# Patient Record
Sex: Male | Born: 1955 | Race: White | Hispanic: No | Marital: Married | Smoking: Former smoker
Health system: Southern US, Community
[De-identification: ages and names within clinical notes are randomized; demographics above are authoritative.]

## PROBLEM LIST (undated history)

## (undated) DIAGNOSIS — G4730 Sleep apnea, unspecified: Secondary | ICD-10-CM

## (undated) DIAGNOSIS — I2510 Atherosclerotic heart disease of native coronary artery without angina pectoris: Secondary | ICD-10-CM

## (undated) DIAGNOSIS — F419 Anxiety disorder, unspecified: Secondary | ICD-10-CM

## (undated) DIAGNOSIS — F329 Major depressive disorder, single episode, unspecified: Secondary | ICD-10-CM

## (undated) DIAGNOSIS — F32A Depression, unspecified: Secondary | ICD-10-CM

---

## 1997-04-21 HISTORY — PX: BACK SURGERY: SHX140

## 1997-04-21 HISTORY — PX: CORONARY ARTERY BYPASS GRAFT: SHX141

## 1997-12-01 ENCOUNTER — Inpatient Hospital Stay (HOSPITAL_COMMUNITY): Admission: AD | Admit: 1997-12-01 | Discharge: 1997-12-09 | Payer: Self-pay | Admitting: Cardiology

## 1997-12-05 ENCOUNTER — Encounter: Payer: Self-pay | Admitting: Cardiothoracic Surgery

## 1997-12-06 ENCOUNTER — Encounter: Payer: Self-pay | Admitting: Cardiothoracic Surgery

## 1997-12-07 ENCOUNTER — Encounter: Payer: Self-pay | Admitting: Cardiothoracic Surgery

## 1997-12-08 ENCOUNTER — Encounter: Payer: Self-pay | Admitting: Cardiothoracic Surgery

## 2000-09-28 ENCOUNTER — Ambulatory Visit (HOSPITAL_COMMUNITY): Admission: RE | Admit: 2000-09-28 | Discharge: 2000-09-28 | Payer: Self-pay | Admitting: Family Medicine

## 2000-09-28 ENCOUNTER — Encounter: Payer: Self-pay | Admitting: Family Medicine

## 2000-10-05 ENCOUNTER — Ambulatory Visit (HOSPITAL_COMMUNITY): Admission: RE | Admit: 2000-10-05 | Discharge: 2000-10-05 | Payer: Self-pay | Admitting: Urology

## 2000-10-05 ENCOUNTER — Encounter: Payer: Self-pay | Admitting: Urology

## 2000-10-26 ENCOUNTER — Encounter: Payer: Self-pay | Admitting: Urology

## 2000-10-26 ENCOUNTER — Ambulatory Visit (HOSPITAL_COMMUNITY): Admission: RE | Admit: 2000-10-26 | Discharge: 2000-10-26 | Payer: Self-pay | Admitting: Urology

## 2002-09-05 ENCOUNTER — Ambulatory Visit (HOSPITAL_COMMUNITY): Admission: RE | Admit: 2002-09-05 | Discharge: 2002-09-05 | Payer: Self-pay | Admitting: Family Medicine

## 2002-09-05 ENCOUNTER — Encounter: Payer: Self-pay | Admitting: Family Medicine

## 2002-10-20 ENCOUNTER — Ambulatory Visit (HOSPITAL_COMMUNITY): Admission: RE | Admit: 2002-10-20 | Discharge: 2002-10-20 | Payer: Self-pay | Admitting: Urology

## 2002-10-20 ENCOUNTER — Encounter: Payer: Self-pay | Admitting: Urology

## 2002-11-01 ENCOUNTER — Ambulatory Visit (HOSPITAL_COMMUNITY): Admission: RE | Admit: 2002-11-01 | Discharge: 2002-11-01 | Payer: Self-pay | Admitting: Urology

## 2002-11-01 ENCOUNTER — Encounter: Payer: Self-pay | Admitting: Urology

## 2002-11-18 ENCOUNTER — Encounter: Payer: Self-pay | Admitting: Urology

## 2002-11-18 ENCOUNTER — Ambulatory Visit (HOSPITAL_COMMUNITY): Admission: RE | Admit: 2002-11-18 | Discharge: 2002-11-18 | Payer: Self-pay | Admitting: Urology

## 2004-10-18 ENCOUNTER — Emergency Department (HOSPITAL_COMMUNITY): Admission: EM | Admit: 2004-10-18 | Discharge: 2004-10-18 | Payer: Self-pay | Admitting: Emergency Medicine

## 2004-10-23 ENCOUNTER — Ambulatory Visit (HOSPITAL_COMMUNITY): Admission: RE | Admit: 2004-10-23 | Discharge: 2004-10-23 | Payer: Self-pay | Admitting: Urology

## 2004-12-05 ENCOUNTER — Ambulatory Visit (HOSPITAL_COMMUNITY): Admission: RE | Admit: 2004-12-05 | Discharge: 2004-12-05 | Payer: Self-pay | Admitting: Urology

## 2009-03-22 ENCOUNTER — Ambulatory Visit (HOSPITAL_COMMUNITY): Admission: RE | Admit: 2009-03-22 | Discharge: 2009-03-22 | Payer: Self-pay | Admitting: Family Medicine

## 2010-06-26 ENCOUNTER — Ambulatory Visit (HOSPITAL_COMMUNITY)
Admission: RE | Admit: 2010-06-26 | Discharge: 2010-06-26 | Disposition: A | Discharge status: Home / Self Care | Payer: PRIVATE HEALTH INSURANCE | Source: Ambulatory Visit | Attending: Family Medicine | Admitting: Family Medicine

## 2010-06-26 ENCOUNTER — Encounter (HOSPITAL_COMMUNITY): Payer: Self-pay

## 2010-06-26 ENCOUNTER — Other Ambulatory Visit (HOSPITAL_COMMUNITY): Payer: Self-pay | Admitting: Family Medicine

## 2010-06-26 DIAGNOSIS — R05 Cough: Secondary | ICD-10-CM | POA: Insufficient documentation

## 2010-06-26 DIAGNOSIS — R059 Cough, unspecified: Secondary | ICD-10-CM | POA: Insufficient documentation

## 2010-09-06 NOTE — H&P (Signed)
Scott Reeves, Scott Reeves                ACCOUNT NO.:  [id number]   MEDICAL RECORD NO.:  [id number]          PATIENT TYPE:  AMB   LOCATION:  DAY                           FACILITY:  [facility]   PHYSICIAN:  Ky Barban, M.D.DATE OF BIRTH:  1955/05/16   DATE OF ADMISSION:  10/21/2004  DATE OF DISCHARGE:  LH                                HISTORY & PHYSICAL   HISTORY OF PRESENT ILLNESS:  Scott Reeves is a 55 year old gentleman last seen  in July 2004.  At that time, he had a stone in the right ureter and we  attempted to do urethroscopic extraction.  At that time, I found his stone  is already gone.  I have not seen him since.  He did have a stone 5 mm in  the left kidney.  Last week he presented to the emergency room with right  ureteral colic.  He has another stone in the right kidney.  He was treated  symptomatically and he has no other medical problems.  He was advised to  undergo ESWL of the right renal calculus and I also told him once the right  side clears up, we need to go after the other stone in the left kidney  before it gets any bigger.  They understand and want me to go ahead and  schedule.   PAST MEDICAL HISTORY:  1.  ESWL left renal calculus.  2.  No history of diabetes or hypertension.   FAMILY HISTORY:  Unremarkable.   SOCIAL HISTORY:  He does not smoke or drink.   REVIEW OF SYSTEMS:  Unremarkable.   PHYSICAL EXAMINATION:  GENERAL:  Well-developed, well-nourished in no acute  distress.  VITAL SIGNS:  Blood pressure 130/80, temperature normal.  NEUROLOGIC:  Negative.  HEENT:  Neck is symmetrical.  CARDIAC:  Regular rate and rhythm.  ABDOMEN:  Soft.  Kidneys nonpalpable.  1+ right CVA tenderness.  GENITALIA:  Unremarkable.   IMPRESSION:  Right mild renal calculi.   PLAN:  ESWL on right.  Procedure with risks and complications and need for  additional procedure were discussed in detail with the patient and his wife  and they understand.       MIJ/MEDQ  D:   10/21/2004  T:  10/21/2004  Job:  [id number]

## 2010-09-06 NOTE — H&P (Signed)
   NAME:  Scott Reeves, Scott Reeves                          ACCOUNT NO.:  [id number]   MEDICAL RECORD NO.:  [id number]                   PATIENT TYPE:  AMB   LOCATION:  DAY                                  FACILITY:  [facility]   PHYSICIAN:  Ky Barban, M.D.            DATE OF BIRTH:  Mar 23, 1956   DATE OF ADMISSION:  DATE OF DISCHARGE:                                HISTORY & PHYSICAL   CHIEF COMPLAINT:  Recurrent right renal colic.   HISTORY:  This 55 year old gentleman who has a history of having kidney  stones came to see me in the office a couple of weeks ago with right renal  colic.  He had a CT scan done which showed a 6 mm stone in the right upper  ureter with some hydronephrosis.  He has also small bilateral renal calculi.  I did a KUB, and the stone in the right upper ureter is not visible, so I  have advised him to undergo cystoscopy, right retrograde pyelogram,  ureteroscopic stone basket, possible holmium, laser lithotripsy.  I made it  clear to him that there is no guarantee I will be able to get the stone out.  Complications of this procedure, especially ureter perforation, discussed,  which can lead to open surgery.  The use of a double-J stent was also  discussed.  He understands and wants me to proceed.  He is coming as  outpatient, and we will do the procedure under anesthesia.   PAST HISTORY:  A couple of years ago he had ESL of left renal calculus.  Most of the stone he has passed.  No other significant medical problem.   ALLERGIES:  None.   MEDICATIONS:  None.   REVIEW OF SYSTEMS:  Unremarkable.   PHYSICAL EXAMINATION:  VITAL SIGNS:  Blood pressure 130/80, temperature is  normal.  CENTRAL NERVOUS SYSTEM:  Negative.  HEENT:  Negative.  CHEST:  Symmetrical.  HEART:  Regular sinus rhythm.  No murmur.  ABDOMEN:  Soft, flat.  Liver, spleen, and kidneys are not palpable.  No CVA  tenderness.  GENITOURINARY:  External genitalia is unremarkable.  RECTAL:  Deferred.   IMPRESSION:  Bilateral renal, right ureteral calculus.   PLAN:  Cystoscopy, right retrograde pyelogram, ureteroscopic stone basket,  possible double-J stent.                                              Ky Barban, M.D.   MIJ/MEDQ  D:  10/31/2002  T:  11/01/2002  Job:  [id number]

## 2010-09-06 NOTE — Op Note (Signed)
NAME:  Scott Reeves, Scott Reeves                          ACCOUNT NO.:  [id number]   MEDICAL RECORD NO.:  [id number]                   PATIENT TYPE:  AMB   LOCATION:  DAY                                  FACILITY:  [facility]   PHYSICIAN:  Ky Barban, M.D.            DATE OF BIRTH:  July 05, 1955   DATE OF PROCEDURE:  11/01/2002  DATE OF DISCHARGE:                                 OPERATIVE REPORT   PREOPERATIVE DIAGNOSIS:  Right ureteral calculus.   POSTOPERATIVE DIAGNOSIS:  Right renal calculus.   PROCEDURE:  Cystoscopy, right retrograde pyelogram, ureterorenoscopy,  insertion of double J stent size 5 French 24 cm.   ANESTHESIA:  General endotracheal.   PROCEDURE:  The patient was given general endotracheal anesthesia, placed in  lithotomy position.  After he was prepped and draped, a #25 cystoscope  introduced into the bladder.  It was then inspected.  The right ureteral  orifice catheterized with a wedge catheter.  Hypaque then injected under  fluoroscopic control.  The dye goes up into the upper ureter.  There is  dilatation of the distal ureter but no definite filling defect.  The upper  ureter also looks normal.  At this point I pass a guidewire up into the  renal pelvis and over the guidewire a short balloon dilator was introduced  into the terminal ureter.  It is dilated.  After dilating the ureter with  the balloon the cystoscope is then removed, leaving the guidewire in place.  Alongside the guidewire I introduce short rigid ureteroscope.  I do not see  any stone in the distal ureter so I decided to look into the upper ureter  with the flexible ureteroscope.  That was introduced over the guidewire and  once the scope was up in the mid ureter the guidewire was then removed.  The  ureteroscope was passed up into the upper ureter under direct vision.  No  stone is seen in the uteropelvic junction but there is a stone in the lower  pole calyx which we knew from before that he has.  I  was unable to use the  laser because of the camera settings; I just could not focus that well.  So  I decided to not do anything further.  The ureteroscope was then removed,  then guidewire was reintroduced through the cystoscope.  A 5 French 24 cm  double J stent is introduced, left the string attached to the J stent.  All  the instruments are removed.  The position of the stent is checked with the  fluoroscopy.                                               Ky Barban, M.D.    MIJ/MEDQ  D:  11/01/2002  T:  11/01/2002  Job:  [id number]

## 2013-02-08 ENCOUNTER — Encounter: Payer: Self-pay | Admitting: Family Medicine

## 2013-03-24 ENCOUNTER — Encounter (INDEPENDENT_AMBULATORY_CARE_PROVIDER_SITE_OTHER): Payer: Self-pay

## 2013-03-24 ENCOUNTER — Ambulatory Visit (INDEPENDENT_AMBULATORY_CARE_PROVIDER_SITE_OTHER): Payer: Managed Care, Other (non HMO) | Admitting: Otolaryngology

## 2013-03-24 DIAGNOSIS — H903 Sensorineural hearing loss, bilateral: Secondary | ICD-10-CM

## 2013-12-12 ENCOUNTER — Encounter (HOSPITAL_COMMUNITY): Payer: Self-pay | Admitting: Emergency Medicine

## 2013-12-12 ENCOUNTER — Emergency Department (HOSPITAL_COMMUNITY): Payer: Managed Care, Other (non HMO)

## 2013-12-12 ENCOUNTER — Observation Stay (HOSPITAL_COMMUNITY)
Admission: EM | Admit: 2013-12-12 | Discharge: 2013-12-14 | Disposition: A | Discharge status: Home / Self Care | Payer: Managed Care, Other (non HMO) | Attending: Internal Medicine | Admitting: Internal Medicine

## 2013-12-12 DIAGNOSIS — Z87891 Personal history of nicotine dependence: Secondary | ICD-10-CM | POA: Diagnosis not present

## 2013-12-12 DIAGNOSIS — N289 Disorder of kidney and ureter, unspecified: Secondary | ICD-10-CM | POA: Diagnosis present

## 2013-12-12 DIAGNOSIS — F411 Generalized anxiety disorder: Secondary | ICD-10-CM | POA: Insufficient documentation

## 2013-12-12 DIAGNOSIS — F3289 Other specified depressive episodes: Secondary | ICD-10-CM | POA: Diagnosis not present

## 2013-12-12 DIAGNOSIS — Z951 Presence of aortocoronary bypass graft: Secondary | ICD-10-CM

## 2013-12-12 DIAGNOSIS — R5381 Other malaise: Secondary | ICD-10-CM | POA: Insufficient documentation

## 2013-12-12 DIAGNOSIS — F329 Major depressive disorder, single episode, unspecified: Secondary | ICD-10-CM | POA: Diagnosis not present

## 2013-12-12 DIAGNOSIS — R079 Chest pain, unspecified: Secondary | ICD-10-CM | POA: Insufficient documentation

## 2013-12-12 DIAGNOSIS — Z79899 Other long term (current) drug therapy: Secondary | ICD-10-CM | POA: Diagnosis not present

## 2013-12-12 DIAGNOSIS — I209 Angina pectoris, unspecified: Secondary | ICD-10-CM

## 2013-12-12 DIAGNOSIS — R5383 Other fatigue: Secondary | ICD-10-CM

## 2013-12-12 DIAGNOSIS — Z7982 Long term (current) use of aspirin: Secondary | ICD-10-CM | POA: Insufficient documentation

## 2013-12-12 DIAGNOSIS — R001 Bradycardia, unspecified: Secondary | ICD-10-CM | POA: Diagnosis present

## 2013-12-12 DIAGNOSIS — I200 Unstable angina: Secondary | ICD-10-CM | POA: Diagnosis present

## 2013-12-12 DIAGNOSIS — G4730 Sleep apnea, unspecified: Secondary | ICD-10-CM

## 2013-12-12 DIAGNOSIS — R110 Nausea: Secondary | ICD-10-CM | POA: Insufficient documentation

## 2013-12-12 DIAGNOSIS — I252 Old myocardial infarction: Secondary | ICD-10-CM | POA: Diagnosis not present

## 2013-12-12 DIAGNOSIS — R931 Abnormal findings on diagnostic imaging of heart and coronary circulation: Secondary | ICD-10-CM

## 2013-12-12 DIAGNOSIS — Z9114 Patient's other noncompliance with medication regimen: Secondary | ICD-10-CM

## 2013-12-12 DIAGNOSIS — R0789 Other chest pain: Principal | ICD-10-CM | POA: Insufficient documentation

## 2013-12-12 DIAGNOSIS — Z9861 Coronary angioplasty status: Secondary | ICD-10-CM | POA: Insufficient documentation

## 2013-12-12 DIAGNOSIS — E785 Hyperlipidemia, unspecified: Secondary | ICD-10-CM | POA: Diagnosis present

## 2013-12-12 HISTORY — DX: Depression, unspecified: F32.A

## 2013-12-12 HISTORY — DX: Major depressive disorder, single episode, unspecified: F32.9

## 2013-12-12 HISTORY — DX: Sleep apnea, unspecified: G47.30

## 2013-12-12 HISTORY — DX: Anxiety disorder, unspecified: F41.9

## 2013-12-12 HISTORY — DX: Atherosclerotic heart disease of native coronary artery without angina pectoris: I25.10

## 2013-12-12 LAB — CBC
HCT: 37.4 % — ABNORMAL LOW (ref 39.0–52.0)
Hemoglobin: 13.3 g/dL (ref 13.0–17.0)
MCH: 29.8 pg (ref 26.0–34.0)
MCHC: 35.6 g/dL (ref 30.0–36.0)
MCV: 83.7 fL (ref 78.0–100.0)
PLATELETS: 139 10*3/uL — AB (ref 150–400)
RBC: 4.47 MIL/uL (ref 4.22–5.81)
RDW: 13.4 % (ref 11.5–15.5)
WBC: 4.5 10*3/uL (ref 4.0–10.5)

## 2013-12-12 MED ORDER — ASPIRIN 81 MG PO CHEW: 324.0000 mg | CHEWABLE_TABLET | Freq: Once | ORAL | Status: DC

## 2013-12-12 MED ORDER — NITROGLYCERIN 0.4 MG SL SUBL: 0.4000 mg | SUBLINGUAL_TABLET | SUBLINGUAL | Status: DC | PRN

## 2013-12-12 NOTE — ED Notes (Signed)
Patient presents to ER via [facility] with c/o chest pain since 2130.  Patient states pain radiates into jaw.

## 2013-12-12 NOTE — ED Notes (Signed)
EMS gave ASA  po and NTG x 3 SL en route to ER.

## 2013-12-12 NOTE — ED Provider Notes (Signed)
CSN: [id number]     Arrival date & time 12/12/13  2321 History  This chart was scribed for Vida Roller, MD, by Yevette Edwards, ED Scribe. This patient was seen in room [facility] and the patient's care was started at 11:33 PM.  First MD Initiated Contact with Patient 12/12/13 2327     Chief Complaint  Patient presents with  . Chest Pain    The history is provided by the patient. No language interpreter was used.   HPI Comments: KADYN GUILD is a 58 y.o. Male, with a h/o acute MI, who presents to the Emergency Department complaining of chest pain which he characterizes as "indigestion" such as "burning." He reports the chest pain occurred after he had taken his medication and laid down this evening.  He also endorses nausea and the sensation that his jaw felt like it was cramping. He reports he feels weak and tired all the time.  He takes 325 mg aspirin daily; he took one today. At bedside, Mr. Cedano still has mild persistent chest pain,  poorly described, but improved with nitro.  The pt reports in 1999 he had a quadruple bypass at [facility]. He had a nuclear stress test three years ago; it was normal. He denies difficulty with exercise or exertion. He denies diaphoresis. He also denies fever, cough, lower extremity swelling, numbness, or weakness to his extremities, speech difficulty, or vision changes. He also denies DM or HTN. He endorses hyperlipidemia. Mr. Cada is a former smoker.   Past Medical History  Diagnosis Date  . Acute MI   . Sleep apnea   . Anxiety   . Depression    Past Surgical History  Procedure Laterality Date  . Back surgery    . Cardiac surgery    . Coronary artery bypass graft      x4   History reviewed. No pertinent family history. History  Substance Use Topics  . Smoking status: Former Games developer  . Smokeless tobacco: Not on file  . Alcohol Use: No    Review of Systems  Constitutional: Positive for fatigue. Negative for diaphoresis.  Eyes: Negative  for visual disturbance.  Respiratory: Negative for shortness of breath.   Cardiovascular: Positive for chest pain. Negative for leg swelling.  Neurological: Negative for syncope, weakness and numbness.  All other systems reviewed and are negative.   Allergies  Lopid  Home Medications   Prior to Admission medications   Medication Sig Start Date End Date Taking? Authorizing Provider  aspirin 325 MG tablet Take 325 mg by mouth daily.   Yes Historical Provider, MD  atorvastatin (LIPITOR) 20 MG tablet Take 20 mg by mouth daily.   Yes Historical Provider, MD  Omega-3 Fatty Acids (FISH OIL) 1200 MG CAPS Take by mouth.   Yes Historical Provider, MD  sertraline (ZOLOFT) 100 MG tablet Take 150 mg by mouth daily.   Yes Historical Provider, MD   Triage Vitals: BP 138/83  Pulse 59  Resp 19  Ht  (1.676 m)  Wt 185 lb (83.915 kg)  BMI 29.87 kg/m2  SpO2 96%  Physical Exam  Nursing note and vitals reviewed. Constitutional: He is oriented to person, place, and time. He appears well-developed and well-nourished. No distress.  HENT:  Head: Normocephalic and atraumatic.  Eyes: Conjunctivae and EOM are normal.  Neck: Neck supple. No tracheal deviation present.  Cardiovascular: Normal rate, regular rhythm and normal heart sounds.   No murmur heard. Pulmonary/Chest: Effort normal and breath sounds normal.  No respiratory distress. He has no wheezes. He has no rales. He exhibits no tenderness.  Abdominal: Soft. Bowel sounds are normal. There is no tenderness.  Musculoskeletal: Normal range of motion.  Neurological: He is alert and oriented to person, place, and time.  Skin: Skin is warm and dry.  Psychiatric: He has a normal mood and affect. His behavior is normal.    ED Course  Procedures (including critical care time)  DIAGNOSTIC STUDIES: Oxygen Saturation is 96% on room air, normal by my interpretation.    COORDINATION OF CARE:  11:43 PM- Discussed treatment plan with patient, and  the patient agreed to the plan. The plan includes an EKG, lab work, and imaging.   Labs Review Labs Reviewed  CBC - Abnormal; Notable for the following:    HCT 37.4 (*)    Platelets 139 (*)    All other components within normal limits  COMPREHENSIVE METABOLIC PANEL - Abnormal; Notable for the following:    Glucose, Bld 106 (*)    Creatinine, Ser 1.38 (*)    Calcium 10.8 (*)    GFR calc non Af Amer 55 (*)    GFR calc Af Amer 64 (*)    All other components within normal limits  PROTIME-INR - Abnormal; Notable for the following:    Prothrombin Time 15.4 (*)    All other components within normal limits  APTT  TROPONIN I  TROPONIN I  TROPONIN I  TROPONIN I    Imaging Review Dg Chest Portable 1 View  12/13/2013   CLINICAL DATA:  Chest pain tonight.  EXAM: PORTABLE CHEST - 1 VIEW  COMPARISON:  06/26/2010  FINDINGS: Postoperative changes in the mediastinum. The heart size and mediastinal contours are within normal limits. Both lungs are clear. The visualized skeletal structures are unremarkable.  IMPRESSION: No active disease.   Electronically Signed   By: [name] M.D.   On: 12/13/2013 00:02     EKG Interpretation   Date/Time:  Monday December 12 2013 23:26:33 EDT Ventricular Rate:  61 PR Interval:  126 QRS Duration: 99 QT Interval:  364 QTC Calculation: 367 R Axis:   48 Text Interpretation:  Sinus rhythm Borderline repolarization abnormality T  wave inversion in the lateral and lateral precordial leads Abnormal ECG No  old tracing to compare Confirmed by Vinaya Sancho  MD, Delainie Chavana ([id number]) on 12/13/2013  12:15:59 AM      MDM   Final diagnoses:  Other chest pain    Pt has ongoing mild pain- improved after nitro, CXR without acute findings, pt is increased risk for ACS, admitted to internal medicine service after discussed with Dr. Haskell Riling of cardiology at [facility].    Trop neg,  I personally performed the services described in this documentation, which was scribed in  my presence. The recorded information has been reviewed and is accurate.      Vida Roller, MD 12/13/13 [phone]

## 2013-12-13 ENCOUNTER — Encounter (HOSPITAL_COMMUNITY): Payer: Self-pay

## 2013-12-13 DIAGNOSIS — Z951 Presence of aortocoronary bypass graft: Secondary | ICD-10-CM

## 2013-12-13 DIAGNOSIS — N289 Disorder of kidney and ureter, unspecified: Secondary | ICD-10-CM | POA: Diagnosis present

## 2013-12-13 DIAGNOSIS — I059 Rheumatic mitral valve disease, unspecified: Secondary | ICD-10-CM

## 2013-12-13 DIAGNOSIS — R001 Bradycardia, unspecified: Secondary | ICD-10-CM | POA: Diagnosis present

## 2013-12-13 DIAGNOSIS — R0789 Other chest pain: Secondary | ICD-10-CM

## 2013-12-13 DIAGNOSIS — I200 Unstable angina: Secondary | ICD-10-CM | POA: Diagnosis present

## 2013-12-13 DIAGNOSIS — R079 Chest pain, unspecified: Secondary | ICD-10-CM | POA: Diagnosis present

## 2013-12-13 DIAGNOSIS — E785 Hyperlipidemia, unspecified: Secondary | ICD-10-CM | POA: Diagnosis present

## 2013-12-13 LAB — COMPREHENSIVE METABOLIC PANEL
ALBUMIN: 3.7 g/dL (ref 3.5–5.2)
ALK PHOS: 89 U/L (ref 39–117)
ALT: 15 U/L (ref 0–53)
AST: 14 U/L (ref 0–37)
Anion gap: 10 (ref 5–15)
BUN: 16 mg/dL (ref 6–23)
CO2: 26 mEq/L (ref 19–32)
CREATININE: 1.38 mg/dL — AB (ref 0.50–1.35)
Calcium: 10.8 mg/dL — ABNORMAL HIGH (ref 8.4–10.5)
Chloride: 102 mEq/L (ref 96–112)
GFR calc non Af Amer: 55 mL/min — ABNORMAL LOW (ref >90)
GFR, EST AFRICAN AMERICAN: 64 mL/min — AB (ref >90)
GLUCOSE: 106 mg/dL — AB (ref 70–99)
POTASSIUM: 4 meq/L (ref 3.7–5.3)
SODIUM: 138 meq/L (ref 137–147)
TOTAL PROTEIN: 6.3 g/dL (ref 6.0–8.3)
Total Bilirubin: 0.6 mg/dL (ref 0.3–1.2)

## 2013-12-13 LAB — TROPONIN I
Troponin I: <0.3 ng/mL (ref <0.30)
Troponin I: <0.3 ng/mL (ref <0.30)
Troponin I: <0.3 ng/mL (ref <0.30)
Troponin I: <0.3 ng/mL (ref <0.30)

## 2013-12-13 LAB — PROTIME-INR
INR: 1.22 (ref 0.00–1.49)
Prothrombin Time: 15.4 seconds — ABNORMAL HIGH (ref 11.6–15.2)

## 2013-12-13 LAB — APTT: aPTT: 33 seconds (ref 24–37)

## 2013-12-13 MED ORDER — PANTOPRAZOLE SODIUM 40 MG PO TBEC
40.0000 mg | DELAYED_RELEASE_TABLET | Freq: Every day | ORAL | Status: DC
  Administered 2013-12-13 – 2013-12-14 (×2): 40 mg via ORAL
  Filled 2013-12-13 (×2): qty 1

## 2013-12-13 MED ORDER — ASPIRIN 325 MG PO TABS
325.0000 mg | ORAL_TABLET | Freq: Every day | ORAL | Status: DC
  Filled 2013-12-13: qty 1

## 2013-12-13 MED ORDER — ATORVASTATIN CALCIUM 20 MG PO TABS
20.0000 mg | ORAL_TABLET | ORAL | Status: DC
  Administered 2013-12-13: 20 mg via ORAL
  Filled 2013-12-13: qty 1

## 2013-12-13 MED ORDER — SERTRALINE HCL 50 MG PO TABS
150.0000 mg | ORAL_TABLET | ORAL | Status: DC
  Administered 2013-12-13: 150 mg via ORAL
  Filled 2013-12-13: qty 3

## 2013-12-13 MED ORDER — ENOXAPARIN SODIUM 100 MG/ML ~~LOC~~ SOLN
85.0000 mg | Freq: Two times a day (BID) | SUBCUTANEOUS | Status: DC
  Administered 2013-12-13 – 2013-12-14 (×3): 85 mg via SUBCUTANEOUS
  Filled 2013-12-13 (×3): qty 1

## 2013-12-13 MED ORDER — ACETAMINOPHEN 325 MG PO TABS: 650.0000 mg | ORAL_TABLET | ORAL | Status: DC | PRN

## 2013-12-13 MED ORDER — SODIUM CHLORIDE 0.9 % IV SOLN
INTRAVENOUS | Status: DC
  Administered 2013-12-13 – 2013-12-14 (×2): via INTRAVENOUS

## 2013-12-13 MED ORDER — ATORVASTATIN CALCIUM 20 MG PO TABS
20.0000 mg | ORAL_TABLET | Freq: Every day | ORAL | Status: DC
  Filled 2013-12-13: qty 1

## 2013-12-13 MED ORDER — ASPIRIN 325 MG PO TABS
325.0000 mg | ORAL_TABLET | ORAL | Status: DC
  Administered 2013-12-13: 325 mg via ORAL
  Filled 2013-12-13: qty 1

## 2013-12-13 MED ORDER — ONDANSETRON HCL 4 MG/2ML IJ SOLN
4.0000 mg | Freq: Three times a day (TID) | INTRAMUSCULAR | Status: DC | PRN
Start: 2013-12-13 — End: 2013-12-13

## 2013-12-13 MED ORDER — ONDANSETRON HCL 4 MG/2ML IJ SOLN: 4.0000 mg | Freq: Four times a day (QID) | INTRAMUSCULAR | Status: DC | PRN

## 2013-12-13 MED ORDER — SERTRALINE HCL 50 MG PO TABS
150.0000 mg | ORAL_TABLET | Freq: Every day | ORAL | Status: DC
  Filled 2013-12-13: qty 3

## 2013-12-13 NOTE — Progress Notes (Signed)
ANTICOAGULATION CONSULT NOTE - Initial Consult  Pharmacy Consult for Lovenox  Indication: chest pain/ACS  Allergies  Allergen Reactions  . Lopid [Gemfibrozil]     Patient Measurements: Height:  (167.6 cm) Weight: 188 lb 8 oz (85.503 kg) IBW/kg (Calculated) : 63.8 Heparin Dosing Weight:   Vital Signs: Temp: 97.8 F (36.6 C) (08/25 0522) Temp src: Oral (08/25 0522) BP: 135/77 mmHg (08/25 0522) Pulse Rate: 52 (08/25 0522)  Labs:  Recent Labs  12/12/13 2345 12/13/13 0312  HGB 13.3  --   HCT 37.4*  --   PLT 139*  --   APTT 33  --   LABPROT 15.4*  --   INR 1.22  --   CREATININE 1.38*  --   TROPONINI <0.30 <0.30    Estimated Creatinine Clearance: 60.6 ml/min (by C-G formula based on Cr of 1.38).   Medical History: Past Medical History  Diagnosis Date  . Acute MI   . Sleep apnea   . Anxiety   . Depression     Medications:  Scheduled:  . aspirin  324 mg Oral Once  . aspirin  325 mg Oral Daily  . atorvastatin  20 mg Oral Daily  . enoxaparin (LOVENOX) injection  85 mg Subcutaneous Q12H  . sertraline  150 mg Oral Daily    Assessment: Unstable angina Lovenox until rules out. Serial cardiac enzymes. Stress testing in AM. Cardiology consult PTA medications reviewed Labs reviewed  Goal of Therapy:  Anti-Xa level 0.6-1 units/ml 4hrs after LMWH dose given Monitor platelets by anticoagulation protocol: Yes   Plan:  Lovenox 1 mg/kg (85 mg) SQ every 12 hours Monitor renal function Monitor CBC, platelets F/U cardiology consult  Raquel James, Xinyi Batton Bennett 12/13/2013,7:37 AM

## 2013-12-13 NOTE — H&P (Signed)
PCP:   Colette Ribas, MD   Chief Complaint:  cp  HPI: 58 yo male h/o 4 v cabg in 1999 comes in with sudden onset of sscp earlier tonight that radiated to his left jaw.  This felt similar to his heart attack he had in 1999.  He came to ED and received 3 rounds of ntg sl and this totally relieved his pain and had none since.  The pain lasted for over an hour.  No sob.  No n/v with it.  No diaphoresis.  He has had cardiac f/u since 1999, with most recent stress testing done 2 years ago which he was never called and told one way or the other if normal.  Assumed it was normal, b/c he never got a call.  Has not seen cards since.  No le edema or swelling.  Has never had cp since 1999, does not ever use ntg at home and has no script for it.  No h/o chf.  Does not watch his cholesterol intake.  Nonsmoker.  Very strong family h/o early CAD.  Review of Systems:  Positive and negative as per HPI otherwise all other systems are negative  Past Medical History: Past Medical History  Diagnosis Date  . Acute MI    Past Surgical History  Procedure Laterality Date  . Back surgery    . Cardiac surgery      Medications: Prior to Admission medications   Not on File    Allergies:   Allergies  Allergen Reactions  . Lopid [Gemfibrozil]     Social History:  reports that he has quit smoking. He does not have any smokeless tobacco history on file. He reports that he does not drink alcohol or use illicit drugs.  Family History: CAD  Physical Exam: Filed Vitals:   12/13/13 0022  BP: 138/83  Pulse: 59  Resp: 19  SpO2: 96%   General appearance: alert, cooperative and no distress Head: Normocephalic, without obvious abnormality, atraumatic Eyes: negative Nose: Nares normal. Septum midline. Mucosa normal. No drainage or sinus tenderness. Neck: no JVD and supple, symmetrical, trachea midline Lungs: clear to auscultation bilaterally Heart: regular rate and rhythm, S1, S2 normal, no murmur,  click, rub or gallop Abdomen: soft, non-tender; bowel sounds normal; no masses,  no organomegaly Extremities: extremities normal, atraumatic, no cyanosis or edema Pulses: 2+ and symmetric Skin: Skin color, texture, turgor normal. No rashes or lesions Neurologic: Grossly normal   Labs on Admission:   Recent Labs  12/12/13 2345  NA 138  K 4.0  CL 102  CO2 26  GLUCOSE 106*  BUN 16  CREATININE 1.38*  CALCIUM 10.8*    Recent Labs  12/12/13 2345  AST 14  ALT 15  ALKPHOS 89  BILITOT 0.6  PROT 6.3  ALBUMIN 3.7    Recent Labs  12/12/13 2345  WBC 4.5  HGB 13.3  HCT 37.4*  MCV 83.7  PLT 139*    Recent Labs  12/12/13 2345  TROPONINI <0.30   Radiological Exams on Admission: Dg Chest Portable 1 View  12/13/2013   CLINICAL DATA:  Chest pain tonight.  EXAM: PORTABLE CHEST - 1 VIEW  COMPARISON:  06/26/2010  FINDINGS: Postoperative changes in the mediastinum. The heart size and mediastinal contours are within normal limits. Both lungs are clear. The visualized skeletal structures are unremarkable.  IMPRESSION: No active disease.   Electronically Signed   By: [name] M.D.   On: 12/13/2013 00:02    Assessment/Plan  58 yo male with [place] with significant h/o 4v cabg 16 years ago  Principal Problem:   Unstable angina-  Cover with lovenox until rules out.  Serial cardiac enzymes.  Keep npo after midnight for stress testing in am.  Cardiology consult.  Cardiology fellow at [facility] was called, and recommended stress testing first.  Await cards eval here.  Cp free at this time.  If recurs place ntg paste.  Aspirin ordered.  Active Problems:   S/P CABG (coronary artery bypass graft)   HLD (hyperlipidemia)   Renal insufficiency, mild  obs on tele.  Full code.  Allsion [place] A 12/13/2013, 12:26 AM
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
## 2013-12-13 NOTE — Progress Notes (Signed)
The patient is a 58 year old man with a history of CAD, status post 4 vessel CABG, hypertension, and obstructive sleep apnea (noncompliant with CPAP) who was admitted this morning by Dr. Onalee Hua for chest pain radiating to his left jaw. The patient was briefly seen and examined. His medical record, vital signs, and laboratory studies were reviewed. He was briefly discussed with cardiology nurse practitioner, Ms. Lawrence. The patient is ruling out for myocardial infarction, but his symptomatology is consistent with unstable angina. We will continue current management. Per cardiology, an echocardiogram has been ordered. Exercise Cardiolite stress test is planned. Will start gentle IV fluids as the patient does have some mild renal insufficiency. We'll start Protonix empirically. For his bradycardia, we will order a TSH. We'll check laboratory studies for tomorrow morning.

## 2013-12-13 NOTE — Progress Notes (Signed)
  Echocardiogram 2D Echocardiogram has been performed.  Annia Gomm 12/13/2013, 3:13 PM

## 2013-12-13 NOTE — ED Notes (Signed)
Beeped unassigned cardiology through [facility] to EDP's phone.

## 2013-12-14 ENCOUNTER — Observation Stay (HOSPITAL_COMMUNITY): Payer: Managed Care, Other (non HMO)

## 2013-12-14 ENCOUNTER — Encounter (HOSPITAL_COMMUNITY): Payer: Self-pay

## 2013-12-14 DIAGNOSIS — R9439 Abnormal result of other cardiovascular function study: Secondary | ICD-10-CM

## 2013-12-14 DIAGNOSIS — R079 Chest pain, unspecified: Secondary | ICD-10-CM

## 2013-12-14 DIAGNOSIS — Z91199 Patient's noncompliance with other medical treatment and regimen due to unspecified reason: Secondary | ICD-10-CM

## 2013-12-14 DIAGNOSIS — I498 Other specified cardiac arrhythmias: Secondary | ICD-10-CM

## 2013-12-14 DIAGNOSIS — G4730 Sleep apnea, unspecified: Secondary | ICD-10-CM

## 2013-12-14 DIAGNOSIS — Z9119 Patient's noncompliance with other medical treatment and regimen: Secondary | ICD-10-CM

## 2013-12-14 DIAGNOSIS — I2510 Atherosclerotic heart disease of native coronary artery without angina pectoris: Secondary | ICD-10-CM

## 2013-12-14 LAB — LIPID PANEL
Cholesterol: 124 mg/dL (ref 0–200)
HDL: 32 mg/dL — ABNORMAL LOW (ref >39)
LDL Cholesterol: 58 mg/dL (ref 0–99)
Total CHOL/HDL Ratio: 3.9 RATIO
Triglycerides: 171 mg/dL — ABNORMAL HIGH (ref <150)
VLDL: 34 mg/dL (ref 0–40)

## 2013-12-14 LAB — CBC
HEMATOCRIT: 39.8 % (ref 39.0–52.0)
HEMOGLOBIN: 13.9 g/dL (ref 13.0–17.0)
MCH: 29.3 pg (ref 26.0–34.0)
MCHC: 34.9 g/dL (ref 30.0–36.0)
MCV: 84 fL (ref 78.0–100.0)
Platelets: 132 10*3/uL — ABNORMAL LOW (ref 150–400)
RBC: 4.74 MIL/uL (ref 4.22–5.81)
RDW: 13.4 % (ref 11.5–15.5)
WBC: 3.9 10*3/uL — ABNORMAL LOW (ref 4.0–10.5)

## 2013-12-14 LAB — BASIC METABOLIC PANEL
Anion gap: 7 (ref 5–15)
BUN: 13 mg/dL (ref 6–23)
CO2: 29 mEq/L (ref 19–32)
CREATININE: 1.15 mg/dL (ref 0.50–1.35)
Calcium: 10.3 mg/dL (ref 8.4–10.5)
Chloride: 106 mEq/L (ref 96–112)
GFR, EST AFRICAN AMERICAN: 80 mL/min — AB (ref >90)
GFR, EST NON AFRICAN AMERICAN: 69 mL/min — AB (ref >90)
GLUCOSE: 103 mg/dL — AB (ref 70–99)
Potassium: 4.6 mEq/L (ref 3.7–5.3)
Sodium: 142 mEq/L (ref 137–147)

## 2013-12-14 LAB — TSH: TSH: 4.36 u[IU]/mL (ref 0.350–4.500)

## 2013-12-14 MED ORDER — ASPIRIN EC 81 MG PO TBEC: 81.0000 mg | DELAYED_RELEASE_TABLET | Freq: Every day | ORAL | Status: AC

## 2013-12-14 MED ORDER — NITROGLYCERIN 0.4 MG SL SUBL: 0.4000 mg | SUBLINGUAL_TABLET | SUBLINGUAL | Status: AC | PRN

## 2013-12-14 MED ORDER — SODIUM CHLORIDE 0.9 % IJ SOLN
10.0000 mL | INTRAMUSCULAR | Status: DC | PRN
  Administered 2013-12-14: 10 mL via INTRAVENOUS

## 2013-12-14 MED ORDER — ATORVASTATIN CALCIUM 40 MG PO TABS: 40.0000 mg | ORAL_TABLET | Freq: Every day | ORAL | Status: DC

## 2013-12-14 MED ORDER — TECHNETIUM TC 99M SESTAMIBI GENERIC - CARDIOLITE
30.0000 | Freq: Once | INTRAVENOUS | Status: AC | PRN
  Administered 2013-12-14: 30 via INTRAVENOUS

## 2013-12-14 MED ORDER — ISOSORBIDE MONONITRATE ER 30 MG PO TB24: 30.0000 mg | ORAL_TABLET | Freq: Every day | ORAL | Status: DC

## 2013-12-14 MED ORDER — SODIUM CHLORIDE 0.9 % IJ SOLN
INTRAMUSCULAR | Status: AC
  Filled 2013-12-14: qty 10

## 2013-12-14 MED ORDER — SODIUM CHLORIDE 0.9 % IJ SOLN
INTRAMUSCULAR | Status: AC
  Administered 2013-12-14: 10 mL via INTRAVENOUS
  Filled 2013-12-14: qty 18

## 2013-12-14 MED ORDER — TECHNETIUM TC 99M SESTAMIBI - CARDIOLITE
10.0000 | Freq: Once | INTRAVENOUS | Status: AC | PRN
  Administered 2013-12-14: 11 via INTRAVENOUS

## 2013-12-14 NOTE — Progress Notes (Signed)
Subjective:  No further chest pain since admission  Objective:  Vital Signs in the last 24 hours: Temp:  [97.4 F (36.3 C)-98.3 F (36.8 C)] 97.4 F (36.3 C) (08/26 0646) Pulse Rate:  [52-58] 58 (08/26 0646) Resp:  [18] 18 (08/26 0646) BP: (135-140)/(80-83) 140/83 mmHg (08/26 0646) SpO2:  [96 %] 96 % (08/26 0646)  Intake/Output from previous day: 08/25 0701 - 08/26 0700 In: 1257.7 [I.V.:1257.7] Out: -  Intake/Output from this shift:    Physical Exam: NECK: Without JVD, HJR, or bruit LUNGS: Clear anterior, posterior, lateral HEART: Regular rate and rhythm, no murmur, gallop, rub, bruit, thrill, or heave EXTREMITIES: Without cyanosis, clubbing, or edema   Lab Results:  Recent Labs  12/12/13 2345 12/14/13 0549  WBC 4.5 3.9*  HGB 13.3 13.9  PLT 139* 132*    Recent Labs  12/12/13 2345 12/14/13 0549  NA 138 142  K 4.0 4.6  CL 102 106  CO2 26 29  GLUCOSE 106* 103*  BUN 16 13  CREATININE 1.38* 1.15    Recent Labs  12/13/13 0830 12/13/13 1415  TROPONINI <0.30 <0.30   Hepatic Function Panel  Recent Labs  12/12/13 2345  PROT 6.3  ALBUMIN 3.7  AST 14  ALT 15  ALKPHOS 89  BILITOT 0.6    Recent Labs  12/14/13 0549  CHOL 124   No results found for this basename: PROTIME,  in the last 72 hours  Imaging:   Cardiac Studies: 12/13/13 Study Conclusions  - Left ventricle: The cavity size was normal. Wall thickness was   normal. The estimated ejection fraction was 55%. Wall motion was   normal; there were no regional wall motion abnormalities.   Features are consistent with a pseudonormal left ventricular   filling pattern, with concomitant abnormal relaxation and   increased filling pressure (grade 2 diastolic dysfunction). - Mitral valve: Calcified annulus. There was mild regurgitation. - Right ventricle: Systolic function was mildly reduced. - Right atrium: Central venous pressure (est): 3 mm Hg. - Atrial septum: No defect or patent foramen  ovale was identified. - Tricuspid valve: There was mild regurgitation. - Pulmonary arteries: PA peak pressure: 37 mm Hg (S). - Pericardium, extracardiac: There was no pericardial effusion.  Impressions:  - Normal LV wall thickness and chamber size with LVEF approximately   55% and grade 2 diastolic dysfunction. Mild mitral regurgitation.   Mild tricuspid regurgitation with PASP 37 mm mercury.   Assessment/Plan:   Unstable angina. Patient had ST depression and 3 beats ventricular tachycardia on Stress portion of myoview-no chest pain. Final scan pending. Patient had EKG changes on stress test in 2010, but scan was normal.  CABG 1999 LIMA to LAD, RIMA to RCA, SVG to ramus, SVG to Cfx.  Grade 2 Diastolic dysfunction on 2Decho: no symptoms of CHF.  OSA  HLD   LOS: 2 days    Jacolyn Reedy 12/14/2013, 8:51 AM

## 2013-12-14 NOTE — Discharge Instructions (Signed)

## 2013-12-14 NOTE — Discharge Summary (Signed)
Physician Discharge Summary  Scott Reeves ZOX:[id number] DOB: 11/01/1955 DOA: 12/12/2013  PCP: Colette Ribas, MD  Admit date: 12/12/2013 Discharge date: 12/14/2013  Time spent: 35 minutes  Recommendations for Outpatient Follow-up:  1. Followup with cardiology in one week  Discharge Diagnoses:  Principal Problem:   Unstable angina Active Problems:   S/P CABG (coronary artery bypass graft)   HLD (hyperlipidemia)   Renal insufficiency, mild   Chest pain   Bradycardia   Renal insufficiency   Discharge Condition:stable  Diet recommendation: low salt  Filed Weights   12/13/13 0027 12/13/13 0114  Weight: 83.915 kg (185 lb) 85.503 kg (188 lb 8 oz)    History of present illness and hospital course:  This patient was admitted to the hospital with chest discomfort. He has known coronary artery disease with a history of bypass grafts. He was seen by cardiology and underwent stress testing. It was felt that his discomfort was likely related to angina. He was offered further workup including coronary angiography, but has declined any further procedures at this time. His medications have been adjusted with the addition of Imdur and when necessary nitroglycerin. He is not a candidate for beta blocker due to baseline bradycardia in the 50s. He is continued on aspirin and Lipitor has been increased from 20 mg to 40 mg for increased plaque stabilization. He's been advised to followup closely with cardiology, sooner if he has recurrence of his symptoms.  Procedures: Echo:- Normal LV wall thickness and chamber size with LVEF approximately 55% and grade 2 diastolic dysfunction. Mild mitral regurgitation. Mild tricuspid regurgitation with PASP 37 mm mercury.    Consultations:  Cardiology  Discharge Exam: Filed Vitals:   12/14/13 1444  BP: 122/76  Pulse: 66  Temp: 98.6 F (37 C)  Resp: 18    General: NAD Cardiovascular: S1, S2 RRR Respiratory: CTA B  Discharge  Instructions You were cared for by a hospitalist during your hospital stay. If you have any questions about your discharge medications or the care you received while you were in the hospital after you are discharged, you can call the unit and asked to speak with the hospitalist on call if the hospitalist that took care of you is not available. Once you are discharged, your primary care physician will handle any further medical issues. Please note that NO REFILLS for any discharge medications will be authorized once you are discharged, as it is imperative that you return to your primary care physician (or establish a relationship with a primary care physician if you do not have one) for your aftercare needs so that they can reassess your need for medications and monitor your lab values.  Discharge Instructions   Call MD for:  difficulty breathing, headache or visual disturbances    Complete by:  As directed      Call MD for:  severe uncontrolled pain    Complete by:  As directed      Diet - low sodium heart healthy    Complete by:  As directed      Increase activity slowly    Complete by:  As directed             Medication List    STOP taking these medications       aspirin 325 MG tablet  Replaced by:  aspirin EC 81 MG tablet      TAKE these medications       aspirin EC 81 MG tablet  Take 1 tablet (  81 mg total) by mouth daily.     atorvastatin 40 MG tablet  Commonly known as:  LIPITOR  Take 1 tablet (40 mg total) by mouth daily.     Fish Oil 1200 MG Caps  Take 1,200 mg by mouth at bedtime.     isosorbide mononitrate 30 MG 24 hr tablet  Commonly known as:  IMDUR  Take 1 tablet (30 mg total) by mouth daily.     nitroGLYCERIN 0.4 MG SL tablet  Commonly known as:  NITROSTAT  Place 1 tablet (0.4 mg total) under the tongue every 5 (five) minutes as needed for chest pain.     sertraline 100 MG tablet  Commonly known as:  ZOLOFT  Take 150 mg by mouth at bedtime.        Allergies  Allergen Reactions  . Lopid [Gemfibrozil]        Follow-up Information   Follow up with Nona Dell, MD On 12/21/2013. (at 10:00 am at the [facility] office)    Specialty:  Cardiology   Contact information:   [address] [place] [address] [phone]       Follow up with Colette Ribas, MD. Schedule an appointment as soon as possible for a visit in 2 weeks.   Specialty:  Family Medicine   Contact information:   [address] [place] [address] [phone]        The results of significant diagnostics from this hospitalization (including imaging, microbiology, ancillary and laboratory) are listed below for reference.    Significant Diagnostic Studies: Nm Myocar Multi W/spect W/wall Motion / Ef  12/14/2013   CLINICAL DATA:  58 year old male with a history of coronary artery disease and CABG with angina referred for an ischemic evaluation.  EXAM: MYOCARDIAL IMAGING WITH SPECT (REST AND EXERCISE)  GATED LEFT VENTRICULAR WALL MOTION STUDY  LEFT VENTRICULAR EJECTION FRACTION  TECHNIQUE: Standard myocardial SPECT imaging was performed after resting intravenous injection of 10 mCi Tc-35m sestamibi. Subsequently, exercise tolerance test was performed by the patient under the supervision of the Cardiology staff. At peak-stress, 30 mCi Tc-65m sestamibi was injected intravenously and standard myocardial SPECT imaging was performed. Quantitative gated imaging was also performed to evaluate left ventricular wall motion, and estimate left ventricular ejection fraction.  COMPARISON:  None.  FINDINGS: ECG/stress data: The patient was stressed according to the Bruce protocol for 6 min, achieving a work level of 7 Mets. The resting heart rate 59 beats per min rose to a maximal heart rate of 142 beats per min. This value represents 87% of maximal, age predicted heart rate. The resting blood pressure of 146/84 rose to a maximum blood pressure of 187/88. The stress test was stopped  due to leg discomfort. No chest pain was reported.  The resting ECG demonstrated sinus rhythm with a diffuse, mild nonspecific ST segment abnormality. With stress, numerous PVCs were noted occurring both in isolation as well as complex and triplets. There were 1-2 mm horizontal ST segment depressions and T wave inversions noted in the inferolateral leads.  This is indicative of ischemia.  Raw data: There was no significant extracardiac radiotracer uptake seen, nor significant motion attenuation.  Perfusion: A moderate size defect of mild intensity was noted in the apical inferior, apical inferolateral, mid inferior, and inferolateral walls on stress images, with a mild degree of reversibility when compared to rest images.  Wall Motion: Abnormal septal motion consistent with prior thoracotomy. Remaining walls displayed normal motion. No left ventricular dilation.  Left  Ventricular Ejection Fraction: 53 %  End diastolic volume 107 ml  End systolic volume 50 ml  IMPRESSION: 1. Abnormal exercise Cardiolite stress test. ECG indicative of ischemia in the inferolateral distribution with frequent PVCs. A mild degree of ischemia also noted on perfusion imaging in the aforementioned regions.  2. Abnormal septal motion consistent with prior thoracotomy. Remaining walls displayed normal motion.  3. Left ventricular ejection fraction 53%  4.  Low to intermediate-risk stress test findings.  *2012 Appropriate Use Criteria for Coronary Revascularization Focused Update: J Am Coll Cardiol. 2012;59(9):857-881. http://content.dementiazones.com.aspx?articleid=1201161   Electronically Signed   By: Prentice Docker   On: 12/14/2013 10:00   Dg Chest Portable 1 View  12/13/2013   CLINICAL DATA:  Chest pain tonight.  EXAM: PORTABLE CHEST - 1 VIEW  COMPARISON:  06/26/2010  FINDINGS: Postoperative changes in the mediastinum. The heart size and mediastinal contours are within normal limits. Both lungs are clear. The visualized  skeletal structures are unremarkable.  IMPRESSION: No active disease.   Electronically Signed   By: [name] M.D.   On: 12/13/2013 00:02    Microbiology: No results found for this or any previous visit (from the past 240 hour(s)).   Labs: Basic Metabolic Panel:  Recent Labs Lab 12/12/13 2345 12/14/13 0549  NA 138 142  K 4.0 4.6  CL 102 106  CO2 26 29  GLUCOSE 106* 103*  BUN 16 13  CREATININE 1.38* 1.15  CALCIUM 10.8* 10.3   Liver Function Tests:  Recent Labs Lab 12/12/13 2345  AST 14  ALT 15  ALKPHOS 89  BILITOT 0.6  PROT 6.3  ALBUMIN 3.7   No results found for this basename: LIPASE, AMYLASE,  in the last 168 hours No results found for this basename: AMMONIA,  in the last 168 hours CBC:  Recent Labs Lab 12/12/13 2345 12/14/13 0549  WBC 4.5 3.9*  HGB 13.3 13.9  HCT 37.4* 39.8  MCV 83.7 84.0  PLT 139* 132*   Cardiac Enzymes:  Recent Labs Lab 12/12/13 2345 12/13/13 0312 12/13/13 0830 12/13/13 1415  TROPONINI <0.30 <0.30 <0.30 <0.30   BNP: BNP (last 3 results) No results found for this basename: PROBNP,  in the last 8760 hours CBG: No results found for this basename: GLUCAP,  in the last 168 hours     Signed:  Idonia Zollinger  [facility] 12/14/2013, 9:10 PM

## 2013-12-14 NOTE — Progress Notes (Signed)
Patient's IV was removed and was clean, dry, and intact at removal.  Patient and patient's wife received discharge instructions and scripts and had no further questions/concerns.  Patient was able to teach back about diet, follow-up appointments, and medications.  Patient was also able to teach back about when to take Nitro, how to store it, and side effects.  Patient was escorted to vehicle via wheelchair by nurse tech.

## 2013-12-14 NOTE — Progress Notes (Signed)
Stress Lab Nurses Notes - [facility]  DAMARIOUS HOLTSCLAW 12/14/2013 Reason for doing test: CAD and Chest Pain Type of test: Stress Cardiolite/ Inpatient Nurse performing test: Parke Poisson, RN Nuclear Medicine Tech: Lyndel Pleasure Echo Tech: Not Applicable MD performing test: Dr. Rebecka Apley.Geni Bers PA Family MD: Phillips Odor Test explained and consent signed: Yes.   IV started: Saline lock flushed, No redness or edema and Saline lock from floor Symptoms: fatigue in legs Treatment/Intervention: None Reason test stopped: fatigue and reached target HR After recovery IV was: No redness or edema and Saline Lock flushed Patient to return to Nuc. Med at : 8:45 Patient discharged: Transported back to room 310 via wc Patient's Condition upon discharge was: stable Comments: During test peak BP 197/88 & HR 142.  Recovery BP 129/86 & HR 70.  Symptoms resolved in recovery. Erskine Speed T

## 2013-12-14 NOTE — Progress Notes (Signed)
The patient was seen and examined, and I agree with the assessment and plan as documented above, with modifications as noted below.  Pt has had no further symptoms with respect to chest pain. I reviewed his nuclear MPI study, which suggested a very mild degree of ischemia in the inferolateral distribution, which corresponded with ECG findings. Given that his CABG was performed 16 years ago, he may indeed be having issues with the SVG's to circumflex and/or ramus intermedius. I discussed the option of proceeding with coronary angiography, but the patient is adamant about going home. I instructed him to follow up with cardiology closely, and to contact 911 if symptoms persist in spite of SL nitroglycerin use. I would recommend reducing ASA to 81 mg daily and increasing Lipitor to 40 mg daily for moderate statin therapy, given its plaque stabilizing effects. Due to bradycardia, would not add beta blockers but could consider long-acting nitrates at discharge.

## 2013-12-15 ENCOUNTER — Encounter (HOSPITAL_COMMUNITY): Payer: Managed Care, Other (non HMO)

## 2013-12-15 ENCOUNTER — Ambulatory Visit (HOSPITAL_COMMUNITY): Payer: Managed Care, Other (non HMO)

## 2013-12-21 ENCOUNTER — Ambulatory Visit (INDEPENDENT_AMBULATORY_CARE_PROVIDER_SITE_OTHER): Payer: Managed Care, Other (non HMO) | Admitting: Cardiology

## 2013-12-21 ENCOUNTER — Encounter: Payer: Self-pay | Admitting: Cardiology

## 2013-12-21 VITALS — BP 120/78 | HR 77 | Ht 66.0 in | Wt 187.0 lb

## 2013-12-21 DIAGNOSIS — R9439 Abnormal result of other cardiovascular function study: Secondary | ICD-10-CM

## 2013-12-21 DIAGNOSIS — I2510 Atherosclerotic heart disease of native coronary artery without angina pectoris: Secondary | ICD-10-CM | POA: Insufficient documentation

## 2013-12-21 DIAGNOSIS — E785 Hyperlipidemia, unspecified: Secondary | ICD-10-CM

## 2013-12-21 NOTE — Assessment & Plan Note (Signed)
Patient with known multivessel CAD status post CABG with 57 year old grafts. Cardiolite indicates inferolateral ischemia, most consistent with graft disease. At the present time he is clinically stable without angina, now on long-acting nitrate. He prefers a period of observation rather than pursuing heart catheterization at this time. We will arrange a 6 week visit, I have asked him to seek more more urgent medical attention if his symptoms escalate in the meanwhile however.

## 2013-12-21 NOTE — Assessment & Plan Note (Signed)
Continues on Lipitor, recent LDL 58.

## 2013-12-21 NOTE — Progress Notes (Signed)
Clinical Summary Scott Reeves is a 58 y.o.male former patient of [facility], just recently seen as an inpatient consult at [facility] in late August with chest pain concerning for angina. He ruled out for myocardial infarction and underwent an inpatient Cardiolite and echocardiogram.  Echocardiogram on August 25 reported normal LV wall thickness and chamber size with LVEF approximately 55%, grade 2 diastolic dysfunction, mild mitral regurgitation, mild tricuspid regurgitation, and PASP 37 mm mercury. Exercise Cardiolite on August 26 showed abnormal ST segment depression in the inferolateral leads with perfusion defect indicating mild ischemia in the mid to apical inferolateral wall. LVEF was calculated at 53%.  Results were discussed with the patient during his hospital stay, Dr. Purvis Sheffield recommended proceeding to a heart catheterization, however the patient declined and was discharged home.  He presents today stating that he has had no further chest pain. He is now on a long-acting nitrate in addition to his baseline regimen. 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 We discussed either proceeding on to a heart catheterization, versus continuing a period of observation since he has been stable on medical therapy. His preference is observation for now.  Recent lipid panel showed cholesterol 124, triglycerides 171, HDL 32, and LDL 58.  Allergies  Allergen Reactions  . Lopid [Gemfibrozil]     Current Outpatient Prescriptions  Medication Sig Dispense Refill  . aspirin EC 81 MG tablet Take 1 tablet (81 mg total) by mouth daily.  30 tablet  1  . atorvastatin (LIPITOR) 40 MG tablet Take 1 tablet (40 mg total) by mouth daily.  30 tablet  1  . isosorbide mononitrate (IMDUR) 30 MG 24 hr tablet Take 1 tablet (30 mg total) by mouth daily.  30 tablet  0  . nitroGLYCERIN (NITROSTAT) 0.4 MG SL tablet Place 1 tablet (0.4 mg total) under the  tongue every 5 (five) minutes as needed for chest pain.  30 tablet  0  . Omega-3 Fatty Acids (FISH OIL) 1200 MG CAPS Take 1,200 mg by mouth at bedtime.       . sertraline (ZOLOFT) 100 MG tablet Take 150 mg by mouth at bedtime.        No current facility-administered medications for this visit.    Past Medical History  Diagnosis Date  . Coronary atherosclerosis of native coronary artery     Multivessel s/p CABG 1999  . Sleep apnea   . Anxiety   . Depression     Past Surgical History  Procedure Laterality Date  . Back surgery  1999  . Coronary artery bypass graft  1999    Dr. [name]: LIMA to LAD, RIMA to RCA, SVG to ramus, SVG to circumflex    Family History  Problem Relation Age of Onset  . Heart attack Father     Deceased  . Stroke Brother   . CAD Brother   . CAD Brother     Social History Mr. Gutridge reports that he quit smoking about 16 years ago. His smoking use included Cigarettes. He smoked 0.00 packs per day. He does not have any smokeless tobacco history on file. Mr. Blansett reports that he does not drink alcohol.  Review of Systems Other systems reviewed are negative.  Physical Examination Filed Vitals:   12/21/13 0950  BP: 120/78  Pulse: 77   Filed Weights   12/21/13 0950  Weight: 187 lb (84.823 kg)   Normally nourished appearing, comfortable at rest. HEENT:  Conjunctiva and lids normal, oropharynx clear. Neck: Supple, no elevated JVP or carotid bruits, no thyromegaly. Lungs: Clear to auscultation, nonlabored breathing at rest. Cardiac: Regular rate and rhythm, no S3 or significant systolic murmur, no pericardial rub. Abdomen: Soft, nontender, bowel sounds present. Extremities: No pitting edema, distal pulses 2+. Skin: Warm and dry. Musculoskeletal: No kyphosis. Neuropsychiatric: Alert and oriented x3, affect grossly appropriate.   Problem List and Plan   Abnormal myocardial perfusion study Patient with known multivessel CAD status post CABG  with 57 year old grafts. Cardiolite indicates inferolateral ischemia, most consistent with graft disease. At the present time he is clinically stable without angina, now on long-acting nitrate. He prefers a period of observation rather than pursuing heart catheterization at this time. We will arrange a 6 week visit, I have asked him to seek more more urgent medical attention if his symptoms escalate in the meanwhile however.  HLD (hyperlipidemia) Continues on Lipitor, recent LDL 58.   Jonelle Sidle, M.D., F.A.C.C.

## 2013-12-21 NOTE — Patient Instructions (Signed)
Your physician recommends that you schedule a follow-up appointment in: 6 weeks at the [place] office. Your physician recommends that you continue on your current medications as directed. Please refer to the Current Medication list given to you today.

## 2014-02-06 ENCOUNTER — Ambulatory Visit (INDEPENDENT_AMBULATORY_CARE_PROVIDER_SITE_OTHER): Payer: Managed Care, Other (non HMO) | Admitting: Cardiology

## 2014-02-06 ENCOUNTER — Encounter: Payer: Self-pay | Admitting: Cardiology

## 2014-02-06 VITALS — BP 122/76 | HR 84 | Ht 69.0 in | Wt 190.0 lb

## 2014-02-06 DIAGNOSIS — E785 Hyperlipidemia, unspecified: Secondary | ICD-10-CM

## 2014-02-06 DIAGNOSIS — I2510 Atherosclerotic heart disease of native coronary artery without angina pectoris: Secondary | ICD-10-CM

## 2014-02-06 MED ORDER — ISOSORBIDE MONONITRATE ER 30 MG PO TB24: 30.0000 mg | ORAL_TABLET | Freq: Every day | ORAL | Status: DC

## 2014-02-06 MED ORDER — ATORVASTATIN CALCIUM 40 MG PO TABS: 40.0000 mg | ORAL_TABLET | Freq: Every day | ORAL | Status: DC

## 2014-02-06 NOTE — Assessment & Plan Note (Signed)
Multivessel CAD status post CABG with 57 year old grafts. Cardiolite indicates inferolateral ischemia, most consistent with graft disease. Patient reports no angina symptoms at this time, and does not want to pursue a heart catheterization as discussed above. We will refill his Imdur and continue observation. Certainly, if his symptoms escalate, we will continue to address potential for heart catheterization to assess for revascularization options. He states to me today that he will reconsider this if his symptoms worsen.

## 2014-02-06 NOTE — Assessment & Plan Note (Signed)
He continues on Lipitor. 

## 2014-02-06 NOTE — Progress Notes (Signed)
Clinical Summary [name] is a 58 y.o.male last seen in September. 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 He comes in today for a routine visit, tells me that he has had no further chest pain symptoms. He did run out of Imdur, and we discussed resuming it as an adjunct to his antianginal regimen. He states that his wife very much wants him to go ahead and have a heart catheterization, however he does not want to pursue this at the present time. He is concerned about the finances, and also missing work.  Echocardiogram on August 25 reported normal LV wall thickness and chamber size with LVEF approximately 55%, grade 2 diastolic dysfunction, mild mitral regurgitation, mild tricuspid regurgitation, and PASP 37 mm mercury. Exercise Cardiolite on August 26 showed abnormal ST segment depression in the inferolateral leads with perfusion defect indicating mild ischemia in the mid to apical inferolateral wall. LVEF was calculated at 53%.   Allergies  Allergen Reactions  . Lopid [Gemfibrozil]     Current Outpatient Prescriptions  Medication Sig Dispense Refill  . aspirin EC 81 MG tablet Take 1 tablet (81 mg total) by mouth daily.  30 tablet  1  . atorvastatin (LIPITOR) 40 MG tablet Take 1 tablet (40 mg total) by mouth daily.  90 tablet  3  . isosorbide mononitrate (IMDUR) 30 MG 24 hr tablet Take 1 tablet (30 mg total) by mouth daily.  30 tablet  11  . nitroGLYCERIN (NITROSTAT) 0.4 MG SL tablet Place 1 tablet (0.4 mg total) under the tongue every 5 (five) minutes as needed for chest pain.  30 tablet  0  . Omega-3 Fatty Acids (FISH OIL) 1200 MG CAPS Take 1,200 mg by mouth at bedtime.       . sertraline (ZOLOFT) 100 MG tablet Take 150 mg by mouth at bedtime.        No current facility-administered medications for this visit.    Past Medical History  Diagnosis Date  . Coronary atherosclerosis of native  coronary artery     Multivessel s/p CABG 1999  . Sleep apnea   . Anxiety   . Depression     Past Surgical History  Procedure Laterality Date  . Back surgery  1999  . Coronary artery bypass graft  1999    Dr. Zenaida NieceVan Trigt: LIMA to LAD, RIMA to RCA, SVG to ramus, SVG to circumflex    Social History [name] reports that he quit smoking about 16 years ago. His smoking use included Cigarettes. He smoked 0.00 packs per day. He does not have any smokeless tobacco history on file. [name] reports that he does not drink alcohol.  Review of Systems No palpitations, dizziness, syncope. NYHA class I dyspnea. Still works full-time. Other systems reviewed and negative.  Physical Examination Filed Vitals:   02/06/14 1321  BP: 122/76  Pulse: 84   Filed Weights   02/06/14 1321  Weight: 190 lb (86.183 kg)    Normally nourished appearing, comfortable at rest.  HEENT: Conjunctiva and lids normal, oropharynx clear.  Neck: Supple, no elevated JVP or carotid bruits, no thyromegaly.  Lungs: Clear to auscultation, nonlabored breathing at rest.  Cardiac: Regular rate and rhythm, no S3 or significant systolic murmur, no pericardial rub.  Abdomen: Soft, nontender, bowel sounds present.  Extremities: No pitting edema, distal pulses 2+.  Skin: Warm and dry.  Musculoskeletal: No kyphosis.  Neuropsychiatric: Alert and  oriented x3, affect grossly appropriate.   Problem List and Plan   Coronary atherosclerosis of native coronary artery Multivessel CAD status post CABG with 57 year old grafts. Cardiolite indicates inferolateral ischemia, most consistent with graft disease. Patient reports no angina symptoms at this time, and does not want to pursue a heart catheterization as discussed above. We will refill his Imdur and continue observation. Certainly, if his symptoms escalate, we will continue to address potential for heart catheterization to assess for revascularization options. He states to me  today that he will reconsider this if his symptoms worsen.  HLD (hyperlipidemia) He continues on Lipitor.    Jonelle SidleSamuel G. Taneasha Fuqua, M.D., F.A.C.C.

## 2014-02-06 NOTE — Patient Instructions (Signed)
Your physician recommends that you schedule a follow-up appointment in: 6 weeks     Your physician recommends that you continue on your current medications as directed. Please refer to the Current Medication list given to you today.     Thank you for choosing [facility] !         

## 2014-02-16 ENCOUNTER — Telehealth: Payer: Self-pay | Admitting: Cardiology

## 2014-02-16 MED ORDER — ISOSORBIDE MONONITRATE ER 30 MG PO TB24: 30.0000 mg | ORAL_TABLET | Freq: Every day | ORAL | Status: DC

## 2014-02-16 NOTE — Telephone Encounter (Signed)
Pt wanted 90 day supply from [facility] home delivery. Will call [facility] to CX refills on IMDUR

## 2014-02-16 NOTE — Telephone Encounter (Signed)
Please see refill bin / tgs  °

## 2014-03-23 ENCOUNTER — Encounter: Payer: Self-pay | Admitting: Cardiology

## 2014-03-23 ENCOUNTER — Ambulatory Visit (INDEPENDENT_AMBULATORY_CARE_PROVIDER_SITE_OTHER): Payer: Managed Care, Other (non HMO) | Admitting: Otolaryngology

## 2014-03-23 ENCOUNTER — Ambulatory Visit: Payer: Managed Care, Other (non HMO) | Admitting: Cardiology

## 2014-04-19 ENCOUNTER — Encounter: Payer: Self-pay | Admitting: *Deleted

## 2015-04-09 ENCOUNTER — Telehealth: Payer: Self-pay | Admitting: Cardiology

## 2015-04-09 NOTE — Telephone Encounter (Signed)
I contacted Ms. Jean RosenthalJackson NP regarding faxed ECGs on [name]. He was noted to have sinus rhythm with PVCs, new compared to the prior tracing, however reportedly he was asymptomatic in terms of angina or palpitations, no syncope. LVEF was normal as of last assessment 2015, however he does have ischemic heart disease with prior CABG. Recommendation was to initiate Toprol-XL 25 mg daily for now, and make sure that he has a follow-up visit to go over symptoms in more detail. Ms. Jean RosenthalJackson NP stated that she would arrange both prescribing the Toprol-XL and speaking with him about a follow-up cardiology visit.

## 2015-04-09 NOTE — Telephone Encounter (Signed)
Please contact Terie PurserSamantha Jackson, NP at [phone] (nurse station line) to discuss EKGs that were completed in their office this morning. EKGs were received via fax and placed in Dr. Ival BibleMcDowell's clinical folder.

## 2015-04-13 ENCOUNTER — Other Ambulatory Visit: Payer: Self-pay | Admitting: Cardiology

## 2018-02-05 ENCOUNTER — Emergency Department: Payer: Worker's Compensation

## 2018-02-05 ENCOUNTER — Other Ambulatory Visit: Payer: Self-pay

## 2018-02-05 ENCOUNTER — Emergency Department
Admission: EM | Admit: 2018-02-05 | Discharge: 2018-02-05 | Disposition: A | Discharge status: Home / Self Care | Payer: Worker's Compensation | Attending: Student in an Organized Health Care Education/Training Program | Admitting: Student in an Organized Health Care Education/Training Program

## 2018-02-05 ENCOUNTER — Encounter: Payer: Self-pay | Admitting: Emergency Medicine

## 2018-02-05 DIAGNOSIS — L03012 Cellulitis of left finger: Secondary | ICD-10-CM | POA: Diagnosis not present

## 2018-02-05 DIAGNOSIS — Y939 Activity, unspecified: Secondary | ICD-10-CM | POA: Insufficient documentation

## 2018-02-05 DIAGNOSIS — S60352A Superficial foreign body of left thumb, initial encounter: Secondary | ICD-10-CM | POA: Diagnosis not present

## 2018-02-05 DIAGNOSIS — X58XXXA Exposure to other specified factors, initial encounter: Secondary | ICD-10-CM | POA: Insufficient documentation

## 2018-02-05 DIAGNOSIS — Z7982 Long term (current) use of aspirin: Secondary | ICD-10-CM | POA: Diagnosis not present

## 2018-02-05 DIAGNOSIS — Y999 Unspecified external cause status: Secondary | ICD-10-CM | POA: Diagnosis not present

## 2018-02-05 DIAGNOSIS — Z79899 Other long term (current) drug therapy: Secondary | ICD-10-CM | POA: Diagnosis not present

## 2018-02-05 DIAGNOSIS — Y929 Unspecified place or not applicable: Secondary | ICD-10-CM | POA: Diagnosis not present

## 2018-02-05 DIAGNOSIS — F17220 Nicotine dependence, chewing tobacco, uncomplicated: Secondary | ICD-10-CM | POA: Insufficient documentation

## 2018-02-05 DIAGNOSIS — M79645 Pain in left finger(s): Secondary | ICD-10-CM | POA: Diagnosis present

## 2018-02-05 DIAGNOSIS — T148XXA Other injury of unspecified body region, initial encounter: Secondary | ICD-10-CM

## 2018-02-05 DIAGNOSIS — I2510 Atherosclerotic heart disease of native coronary artery without angina pectoris: Secondary | ICD-10-CM | POA: Diagnosis not present

## 2018-02-05 MED ORDER — BACITRACIN-NEOMYCIN-POLYMYXIN 400-5-5000 EX OINT
TOPICAL_OINTMENT | Freq: Once | CUTANEOUS | Status: AC
  Administered 2018-02-05: 1 via TOPICAL
  Filled 2018-02-05: qty 2

## 2018-02-05 MED ORDER — CEPHALEXIN 500 MG PO CAPS: 500.0000 mg | ORAL_CAPSULE | Freq: Three times a day (TID) | ORAL | 0 refills | Status: DC

## 2018-02-05 NOTE — ED Provider Notes (Signed)
[facility] Emergency Department Provider Note  ____________________________________________   First MD Initiated Contact with Patient 02/05/18 1224     (approximate)  I have reviewed the triage vital signs and the nursing notes.   HISTORY  Chief Complaint Hand Injury    HPI Scott Reeves is a 62 y.o. male presents emergency department complaining of left thumb pain.  He states he gets multiple metal shavings in his hand and feels like there is one stuck in the left thumb.  He states it is become red and swollen.  It is painful to touch.  He denies any fever or chills.  No drainage from site.    Past Medical History:  Diagnosis Date  . Anxiety   . Coronary atherosclerosis of native coronary artery    Multivessel s/p CABG 1999  . Depression   . Sleep apnea     Patient Active Problem List   Diagnosis Date Noted  . Coronary atherosclerosis of native coronary artery 12/21/2013  . Abnormal myocardial perfusion study 12/21/2013  . S/P CABG (coronary artery bypass graft) 12/13/2013  . HLD (hyperlipidemia) 12/13/2013  . Bradycardia 12/13/2013    Past Surgical History:  Procedure Laterality Date  . BACK SURGERY  1999  . CORONARY ARTERY BYPASS GRAFT  1999   Dr. [name]: LIMA to LAD, RIMA to RCA, SVG to ramus, SVG to circumflex    Prior to Admission medications   Medication Sig Start Date End Date Taking? Authorizing Provider  aspirin EC 81 MG tablet Take 1 tablet (81 mg total) by mouth daily. 12/14/13   Erick Blinks, MD  atorvastatin (LIPITOR) 40 MG tablet TAKE 1 TABLET BY MOUTH DAILY 04/13/15   Jonelle Sidle, MD  cephALEXin (KEFLEX) 500 MG capsule Take 1 capsule (500 mg total) by mouth 3 (three) times daily. 02/05/18   Fisher, Roselyn Bering, PA-C  isosorbide mononitrate (IMDUR) 30 MG 24 hr tablet Take 1 tablet (30 mg total) by mouth daily. 02/16/14   Jonelle Sidle, MD  nitroGLYCERIN (NITROSTAT) 0.4 MG SL tablet Place 1 tablet (0.4 mg  total) under the tongue every 5 (five) minutes as needed for chest pain. 12/14/13   Erick Blinks, MD  Omega-3 Fatty Acids (FISH OIL) 1200 MG CAPS Take 1,200 mg by mouth at bedtime.     [provider]  sertraline (ZOLOFT) 100 MG tablet Take 150 mg by mouth at bedtime.     [provider]    Allergies Lopid [gemfibrozil]  Family History  Problem Relation Age of Onset  . Heart attack Father        Deceased  . Stroke Brother   . CAD Brother   . CAD Brother     Social History Social History   Tobacco Use  . Smoking status: Former Smoker    Types: Cigarettes    Last attempt to quit: 04/21/1997    Years since quitting: 20.8  . Smokeless tobacco: Current User    Types: Chew  Substance Use Topics  . Alcohol use: No  . Drug use: No    Review of Systems  Constitutional: No fever/chills Eyes: No visual changes. ENT: No sore throat. Respiratory: Denies cough Genitourinary: Negative for dysuria. Musculoskeletal: Negative for back pain.  Positive for left thumb pain Skin: Negative for rash.    ____________________________________________   PHYSICAL EXAM:  VITAL SIGNS: ED Triage Vitals  Enc Vitals Group     BP 02/05/18 1122 (!) 142/72     Pulse Rate  02/05/18 1122 60     Resp 02/05/18 1122 14     Temp 02/05/18 1122 98.1 F (36.7 C)     Temp Source 02/05/18 1122 Oral     SpO2 02/05/18 1122 100 %     Weight 02/05/18 1122 173 lb (78.5 kg)     Height 02/05/18 1122 5\' 6"  (1.676 m)     Head Circumference --      Peak Flow --      Pain Score 02/05/18 1127 6     Pain Loc --      Pain Edu? --      Excl. in GC? --     Constitutional: Alert and oriented. Well appearing and in no acute distress. Eyes: Conjunctivae are normal.  Head: Atraumatic. Nose: No congestion/rhinnorhea. Mouth/Throat: Mucous membranes are moist.   Neck:  supple no lymphadenopathy noted Cardiovascular: Normal rate, regular rhythm.  Respiratory: Normal respiratory effort.  No  retractions,  GU: deferred Musculoskeletal: FROM all extremities, warm and well perfused.  Positive for redness and swelling at the left thumb.  A small shaving was removed by myself.  There are multiple small shavings noted throughout both hands. Neurologic:  Normal speech and language.  Skin:  Skin is warm, dry and intact. No rash noted. Psychiatric: Mood and affect are normal. Speech and behavior are normal.  ____________________________________________   LABS (all labs ordered are listed, but only abnormal results are displayed)  Labs Reviewed - No data to display ____________________________________________   ____________________________________________  RADIOLOGY  X-ray of the left and right hand both show multiple radiopaque foreign bodies  ____________________________________________   PROCEDURES  Procedure(s) performed: Metal splinter was removed with forceps.  Procedures    ____________________________________________   INITIAL IMPRESSION / ASSESSMENT AND PLAN / ED COURSE  Pertinent labs & imaging results that were available during my care of the patient were reviewed by me and considered in my medical decision making (see chart for details).   Patient is 62 year old male presents emergency department complaining of left thumb pain and swelling from a metal splinter.  On physical exam patient appears well.  Thumb is red and swollen.  The metal shaving was removed with forceps.  X-rays of the hand show multiple radiopaque metal shavings.  Explained the x-rays to the patient.  He was given a prescription for Keflex 500 3 times daily.  He is to wash the area soap and water.  Encouraged him to wear the gloves that he is supposed to wear while shaving metal.  He states he understands will comply.  Was given limited duties for the next 4 days.  No use of the left hand.  He was discharged in stable condition.  Drug screen had been done at [facility].  No drug  screen was done or obtained here in the ED.     As part of my medical decision making, I reviewed the following data within the electronic MEDICAL RECORD NUMBER Nursing notes reviewed and incorporated, Old chart reviewed, Radiograph reviewed x-ray of both hands as noted above, Notes from prior ED visits and [place] Controlled Substance Database  ____________________________________________   FINAL CLINICAL IMPRESSION(S) / ED DIAGNOSES  Final diagnoses:  Cellulitis of finger of left hand  Splinter in skin      NEW MEDICATIONS STARTED DURING THIS VISIT:  Discharge Medication List as of 02/05/2018  1:10 PM    START taking these medications   Details  cephALEXin (KEFLEX) 500 MG capsule Take 1 capsule (500 mg  total) by mouth 3 (three) times daily., Starting Fri 02/05/2018, Normal         Note:  This document was prepared using Dragon voice recognition software and may include unintentional dictation errors.    Faythe Ghee, PA-C 02/05/18 1601    Willy Eddy, MD 02/05/18 [phone]

## 2018-02-05 NOTE — ED Notes (Signed)
First Nurse Note: Patient to ED from [facility].  Workmen's Comp - has had urine drug screen performed at [facility].  Has FB in left hand and right hand, but "left is worse", patient states it could be "stainless steel or brass".

## 2018-02-05 NOTE — ED Triage Notes (Addendum)
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  Some swelling and mild redness noted.  Pt states WC drug screen was done at [facility].

## 2018-02-05 NOTE — Discharge Instructions (Signed)
Follow-up with the acute care or a physician of your Worker's Comp. choice if not better in 3 to 4 days.  Use the medication as prescribed.  Wash the area with soap and water daily.

## 2018-02-05 NOTE — ED Notes (Signed)
Neosporin and bandaid to left thumb.

## 2018-04-29 ENCOUNTER — Other Ambulatory Visit (HOSPITAL_COMMUNITY): Payer: Self-pay | Admitting: Family Medicine

## 2018-04-29 ENCOUNTER — Ambulatory Visit (HOSPITAL_COMMUNITY)
Admission: RE | Admit: 2018-04-29 | Discharge: 2018-04-29 | Disposition: A | Discharge status: Home / Self Care | Payer: PRIVATE HEALTH INSURANCE | Source: Ambulatory Visit | Attending: Family Medicine | Admitting: Family Medicine

## 2018-04-29 DIAGNOSIS — J069 Acute upper respiratory infection, unspecified: Secondary | ICD-10-CM | POA: Diagnosis present

## 2018-05-20 ENCOUNTER — Other Ambulatory Visit: Payer: Self-pay | Admitting: Family Medicine

## 2018-05-20 DIAGNOSIS — R0602 Shortness of breath: Secondary | ICD-10-CM

## 2018-06-09 ENCOUNTER — Ambulatory Visit (HOSPITAL_COMMUNITY): Payer: No Typology Code available for payment source

## 2018-06-25 ENCOUNTER — Ambulatory Visit (HOSPITAL_COMMUNITY)
Admission: RE | Admit: 2018-06-25 | Discharge: 2018-06-25 | Disposition: A | Discharge status: Home / Self Care | Payer: PRIVATE HEALTH INSURANCE | Source: Ambulatory Visit | Attending: Family Medicine | Admitting: Family Medicine

## 2018-06-25 DIAGNOSIS — R0602 Shortness of breath: Secondary | ICD-10-CM | POA: Diagnosis present

## 2018-06-25 LAB — POCT I-STAT CREATININE: CREATININE: 1.2 mg/dL (ref 0.61–1.24)

## 2018-06-25 MED ORDER — IOHEXOL 300 MG/ML  SOLN
75.0000 mL | Freq: Once | INTRAMUSCULAR | Status: AC | PRN
  Administered 2018-06-25: 75 mL via INTRAVENOUS

## 2019-06-06 ENCOUNTER — Other Ambulatory Visit (HOSPITAL_COMMUNITY): Payer: Self-pay | Admitting: General Practice

## 2019-06-06 ENCOUNTER — Other Ambulatory Visit: Payer: Self-pay | Admitting: General Practice

## 2019-06-07 ENCOUNTER — Other Ambulatory Visit: Payer: Self-pay | Admitting: General Practice

## 2019-06-07 ENCOUNTER — Other Ambulatory Visit (HOSPITAL_COMMUNITY): Payer: Self-pay | Admitting: General Practice

## 2019-06-07 DIAGNOSIS — R110 Nausea: Secondary | ICD-10-CM

## 2019-06-07 DIAGNOSIS — R634 Abnormal weight loss: Secondary | ICD-10-CM

## 2019-06-07 DIAGNOSIS — R630 Anorexia: Secondary | ICD-10-CM

## 2019-06-09 ENCOUNTER — Other Ambulatory Visit: Payer: Self-pay

## 2019-06-09 ENCOUNTER — Ambulatory Visit (HOSPITAL_COMMUNITY)
Admission: RE | Admit: 2019-06-09 | Discharge: 2019-06-09 | Disposition: A | Discharge status: Home / Self Care | Payer: BLUE CROSS/BLUE SHIELD | Source: Ambulatory Visit | Attending: General Practice | Admitting: General Practice

## 2019-06-09 DIAGNOSIS — R634 Abnormal weight loss: Secondary | ICD-10-CM | POA: Diagnosis present

## 2019-06-09 DIAGNOSIS — R110 Nausea: Secondary | ICD-10-CM | POA: Diagnosis not present

## 2019-06-09 DIAGNOSIS — R630 Anorexia: Secondary | ICD-10-CM | POA: Diagnosis present

## 2019-06-09 LAB — POCT I-STAT CREATININE: Creatinine, Ser: 1.2 mg/dL (ref 0.61–1.24)

## 2019-06-09 MED ORDER — IOHEXOL 300 MG/ML  SOLN
100.0000 mL | Freq: Once | INTRAMUSCULAR | Status: AC | PRN
  Administered 2019-06-09: 15:00:00 100 mL via INTRAVENOUS

## 2021-06-08 IMAGING — CT CT ABD-PELV W/ CM
2 of 5 series · 16 of 46 positions shown, 18 images · IV contrast (Omnipaque or Isovue)
Comparison: 12/02/2004

CLINICAL DATA: Unintentional 30 pound weight loss over the past 3
months.

EXAM:
CT ABDOMEN AND PELVIS WITH CONTRAST
TECHNIQUE: Multidetector CT imaging of the abdomen and pelvis was performed
using the standard protocol following bolus administration of
intravenous contrast.
CONTRAST:  100mL OMNIPAQUE IOHEXOL 300 MG/ML  SOLN

[Series 2: axial st · axial · 0.82mm/px · z∈[+628,+1032]mm · 13 of 95 slices shown, 15 images]
[im 7/95  soft-tissue]
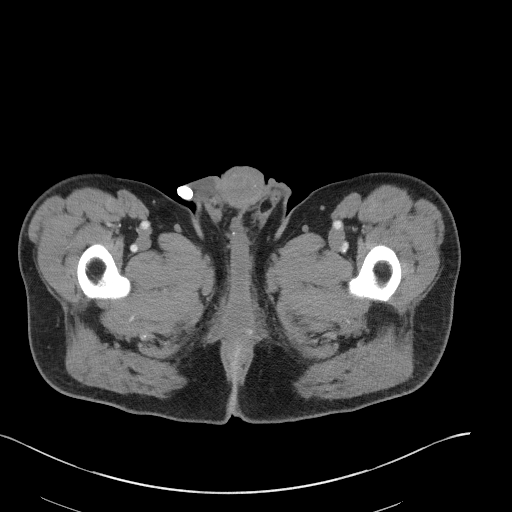
[im 7/95  bone]
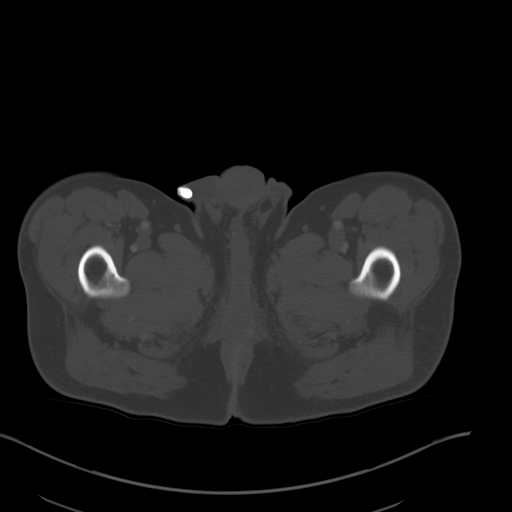
[im 14/95  soft-tissue]
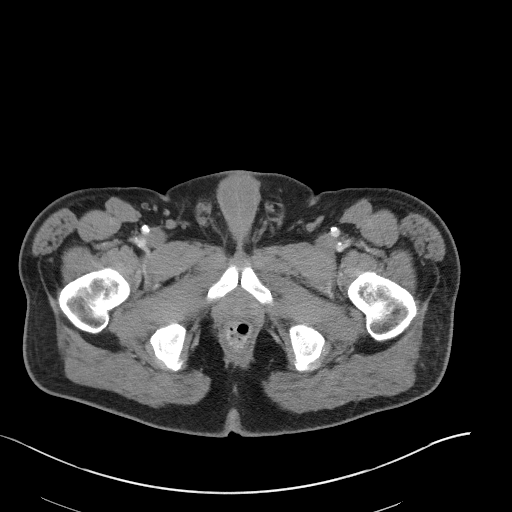
[im 21/95  soft-tissue]
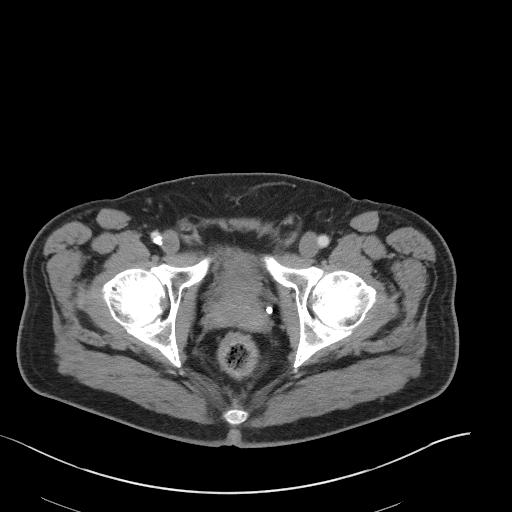
[im 27/95  soft-tissue]
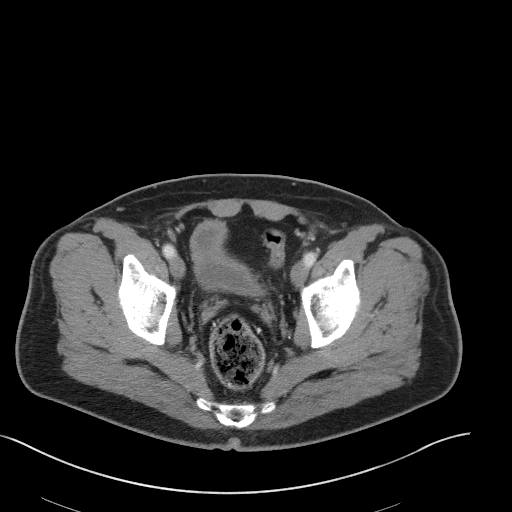
[im 34/95  soft-tissue]
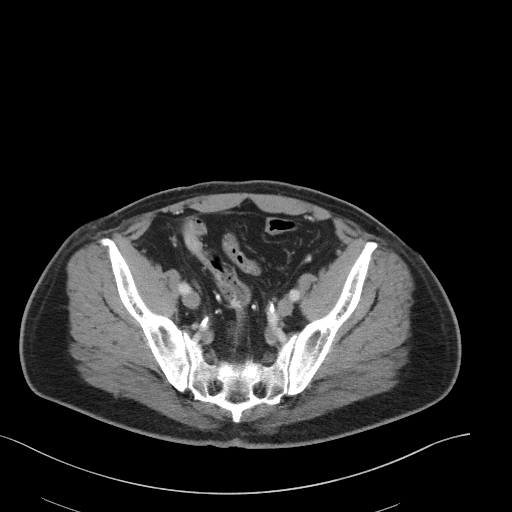
[im 41/95  soft-tissue]
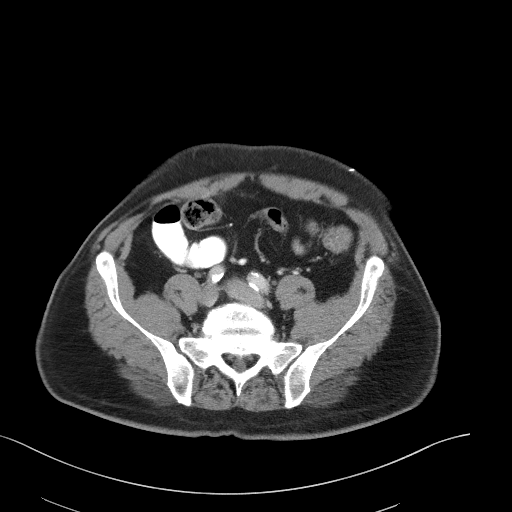
[im 48/95  soft-tissue]
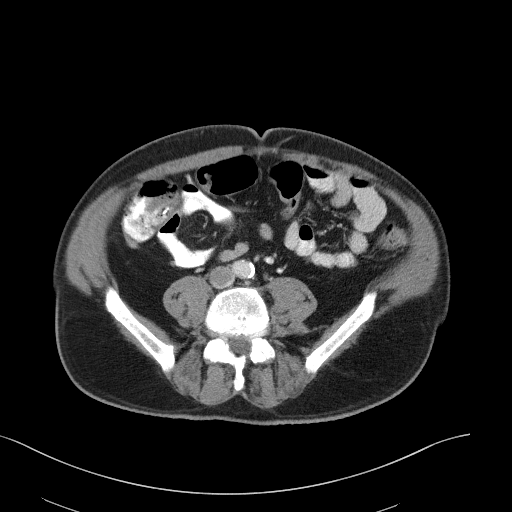
[im 54/95  soft-tissue]
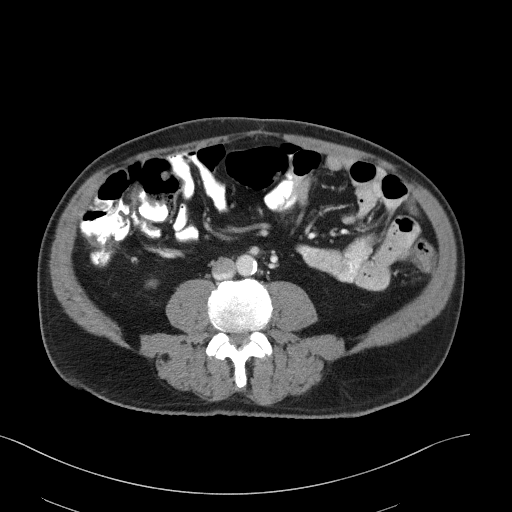
[im 61/95  soft-tissue]
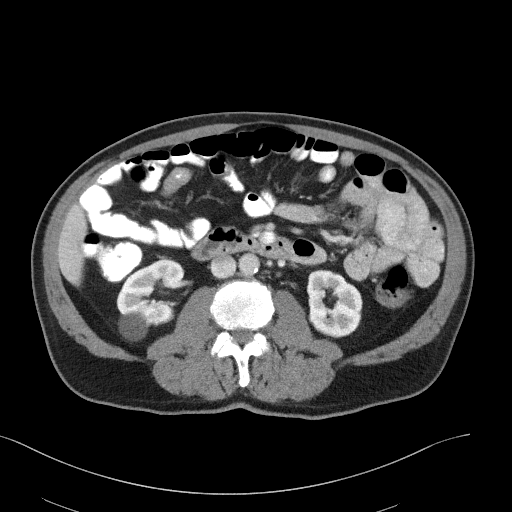
[im 61/95  bone]
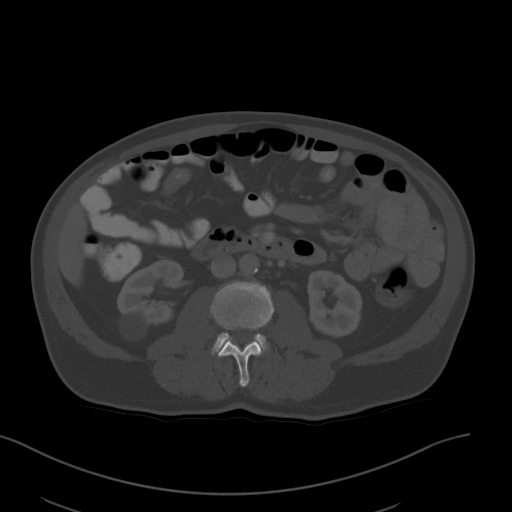
[im 68/95  soft-tissue]
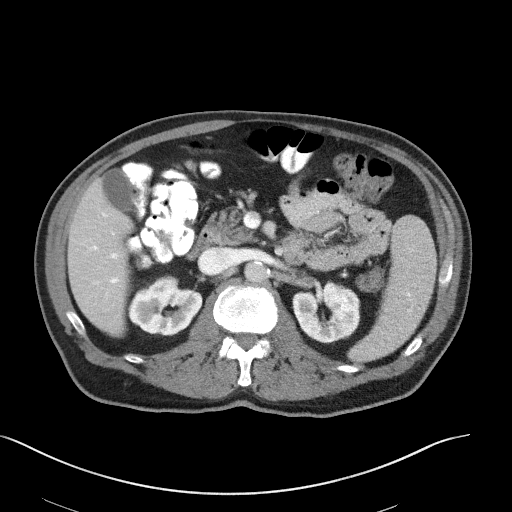
[im 74/95  soft-tissue]
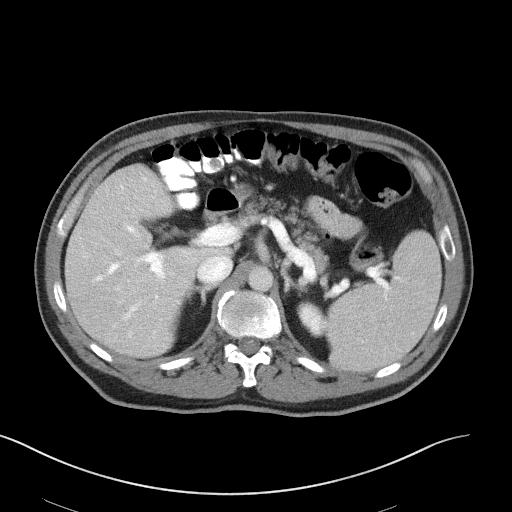
[im 81/95  soft-tissue]
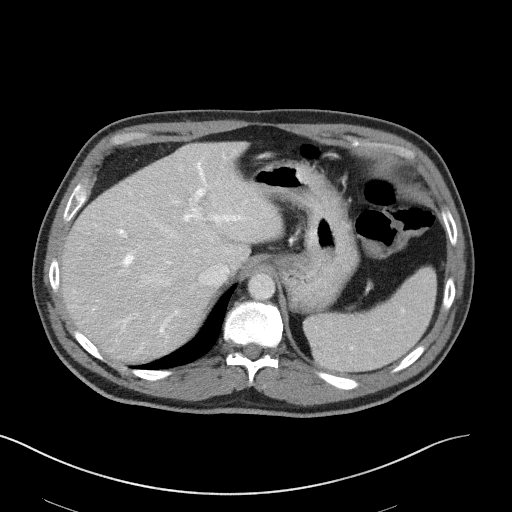
[im 88/95  soft-tissue]
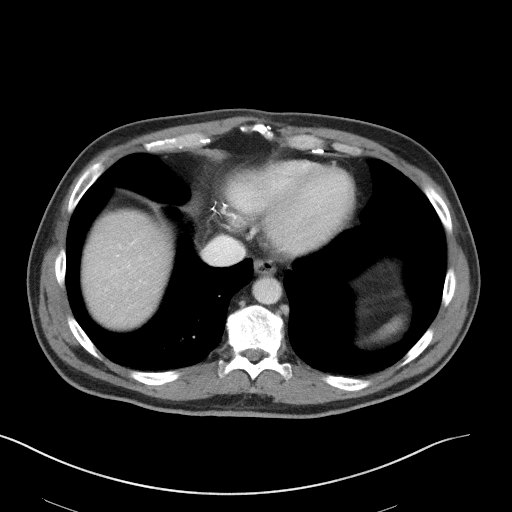

[Series 6: coronal st · coronal · 0.83mm/px · 3 of 117 slices shown]
[im 39/117  soft-tissue]
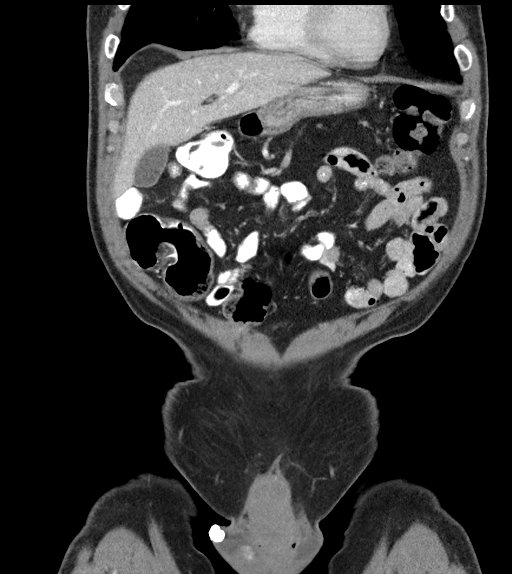
[im 52/117  soft-tissue]
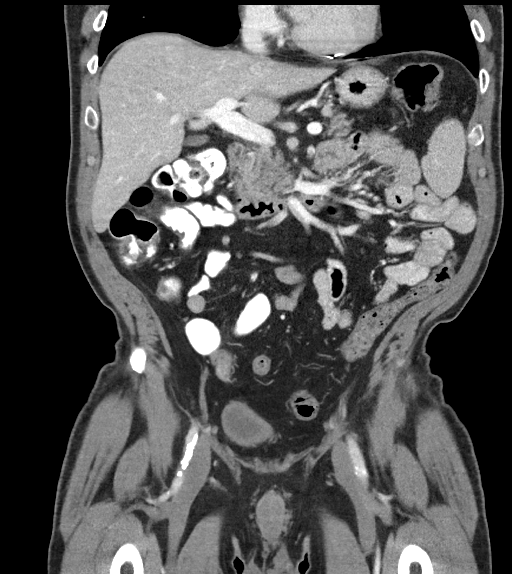
[im 65/117  soft-tissue]
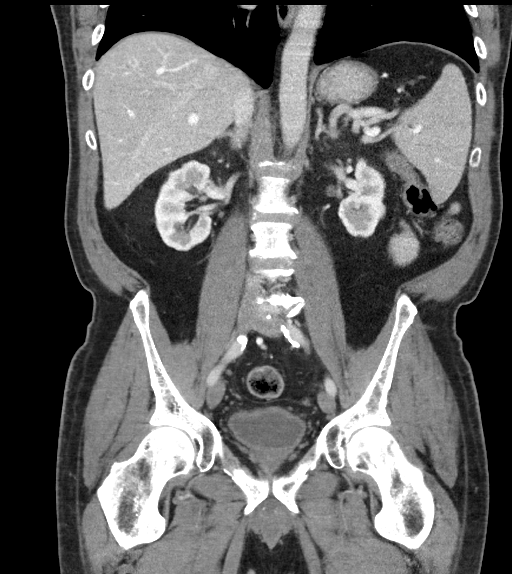

[16 of 46 positions shown; findings below may reference images not displayed]

FINDINGS: Lower chest: The lung bases are clear. The heart size is normal.

Hepatobiliary: The liver is normal. Normal gallbladder.There is no
biliary ductal dilation.

Pancreas: Normal contours without ductal dilatation. No
peripancreatic fluid collection.

Spleen: The spleen is borderline enlarged measuring approximately
12-13 cm in length.

Adrenals/Urinary Tract:

--Adrenal glands: No adrenal hemorrhage.

--Right kidney/ureter: There is a simple cyst arising from the lower
pole. There is no hydronephrosis.

--Left kidney/ureter: There is a nonobstructing stone in the
interpolar region measuring approximately 5 mm (coronal series 6,
image 76). There is a simple cyst arising from the lower pole.

--Urinary bladder: There is bladder wall thickening.

Stomach/Bowel:

--Stomach/Duodenum: No hiatal hernia or other gastric abnormality.
Normal duodenal course and caliber.

--Small bowel: No dilatation or inflammation.

--Colon: No focal abnormality.

--Appendix: Normal.

Vascular/Lymphatic: Atherosclerotic calcification is present within
the non-aneurysmal abdominal aorta, without hemodynamically
significant stenosis.

--No retroperitoneal lymphadenopathy.

--No mesenteric lymphadenopathy.

--No pelvic or inguinal lymphadenopathy.

Reproductive: Unremarkable

Other: No ascites or free air. The abdominal wall is normal.

Musculoskeletal. There is a stable sclerotic lesion in the proximal
left femur. There is no displaced fracture.
IMPRESSION: 1. There is bladder wall thickening, which may be due to incomplete
distention. Recommend correlation with urinalysis to exclude
cystitis.
2. There is a nonobstructive left nephrolithiasis.
3. Borderline splenomegaly.
4. Aortic Atherosclerosis ([id number]).

## 2021-12-12 ENCOUNTER — Other Ambulatory Visit (HOSPITAL_COMMUNITY): Payer: Self-pay | Admitting: Family Medicine

## 2021-12-12 DIAGNOSIS — F17200 Nicotine dependence, unspecified, uncomplicated: Secondary | ICD-10-CM

## 2021-12-31 ENCOUNTER — Encounter: Payer: Self-pay | Admitting: *Deleted

## 2022-01-17 ENCOUNTER — Ambulatory Visit (HOSPITAL_COMMUNITY)
Admission: RE | Admit: 2022-01-17 | Discharge: 2022-01-17 | Disposition: A | Discharge status: Home / Self Care | Payer: Medicare HMO | Source: Ambulatory Visit | Attending: Family Medicine | Admitting: Family Medicine

## 2022-01-17 ENCOUNTER — Encounter (HOSPITAL_COMMUNITY): Payer: Self-pay

## 2022-01-17 DIAGNOSIS — F17200 Nicotine dependence, unspecified, uncomplicated: Secondary | ICD-10-CM

## 2022-01-21 NOTE — Progress Notes (Unsigned)
GI Office Note    Referring Provider: Sharilyn Sites, MD Primary Care Physician:  Coolidge Breeze, FNP  Primary Gastroenterologist:  Chief Complaint   No chief complaint on file.    History of Present Illness   Scott Reeves is a 66 y.o. male presenting today      CT abdomen and pelvis with contrast February 2021 for unintentional 30 pound weight loss: IMPRESSION: 1. There is bladder wall thickening, which may be due to incomplete distention. Recommend correlation with urinalysis to exclude cystitis. 2. There is a nonobstructive left nephrolithiasis. 3. Borderline splenomegaly. 4. Aortic Atherosclerosis (ICD10-I70.0).     Medications   Current Outpatient Medications  Medication Sig Dispense Refill   aspirin EC 81 MG tablet Take 1 tablet (81 mg total) by mouth daily. 30 tablet 1   atorvastatin (LIPITOR) 40 MG tablet TAKE 1 TABLET BY MOUTH DAILY 30 tablet 0   cephALEXin (KEFLEX) 500 MG capsule Take 1 capsule (500 mg total) by mouth 3 (three) times daily. 21 capsule 0   isosorbide mononitrate (IMDUR) 30 MG 24 hr tablet Take 1 tablet (30 mg total) by mouth daily. 90 tablet 3   nitroGLYCERIN (NITROSTAT) 0.4 MG SL tablet Place 1 tablet (0.4 mg total) under the tongue every 5 (five) minutes as needed for chest pain. 30 tablet 0   Omega-3 Fatty Acids (FISH OIL) 1200 MG CAPS Take 1,200 mg by mouth at bedtime.      sertraline (ZOLOFT) 100 MG tablet Take 150 mg by mouth at bedtime.      No current facility-administered medications for this visit.    Allergies   Allergies as of 01/22/2022 - Review Complete 02/05/2018  Allergen Reaction Noted   Lopid [gemfibrozil]  06/26/2010    Past Medical History   Past Medical History:  Diagnosis Date   Anxiety    Coronary atherosclerosis of native coronary artery    Multivessel s/p CABG 1999   Depression    Sleep apnea     Past Surgical History   Past Surgical History:  Procedure Laterality Date   BACK SURGERY  1999    CORONARY ARTERY BYPASS GRAFT  1999   Dr. Lucianne Lei Trigt: LIMA to LAD, RIMA to RCA, SVG to ramus, SVG to circumflex    Past Family History   Family History  Problem Relation Age of Onset   Heart attack Father        Deceased   Stroke Brother    CAD Brother    CAD Brother     Past Social History   Social History   Socioeconomic History   Marital status: Married    Spouse name: Not on file   Number of children: Not on file   Years of education: Not on file   Highest education level: Not on file  Occupational History   Not on file  Tobacco Use   Smoking status: Former    Types: Cigarettes    Quit date: 04/21/1997    Years since quitting: 24.7   Smokeless tobacco: Current    Types: Chew  Substance and Sexual Activity   Alcohol use: No   Drug use: No   Sexual activity: Not on file  Other Topics Concern   Not on file  Social History Narrative   Not on file   Social Determinants of Health   Financial Resource Strain: Not on file  Food Insecurity: Not on file  Transportation Needs: Not on file  Physical Activity: Not on  file  Stress: Not on file  Social Connections: Not on file  Intimate Partner Violence: Not on file    Review of Systems   General: Negative for anorexia, weight loss, fever, chills, fatigue, weakness. Eyes: Negative for vision changes.  ENT: Negative for hoarseness, difficulty swallowing , nasal congestion. CV: Negative for chest pain, angina, palpitations, dyspnea on exertion, peripheral edema.  Respiratory: Negative for dyspnea at rest, dyspnea on exertion, cough, sputum, wheezing.  GI: See history of present illness. GU:  Negative for dysuria, hematuria, urinary incontinence, urinary frequency, nocturnal urination.  MS: Negative for joint pain, low back pain.  Derm: Negative for rash or itching.  Neuro: Negative for weakness, abnormal sensation, seizure, frequent headaches, memory loss,  confusion.  Psych: Negative for anxiety, depression,  suicidal ideation, hallucinations.  Endo: Negative for unusual weight change.  Heme: Negative for bruising or bleeding. Allergy: Negative for rash or hives.  Physical Exam   There were no vitals taken for this visit.   General: Well-nourished, well-developed in no acute distress.  Head: Normocephalic, atraumatic.   Eyes: Conjunctiva pink, no icterus. Mouth: Oropharyngeal mucosa moist and pink , no lesions erythema or exudate. Neck: Supple without thyromegaly, masses, or lymphadenopathy.  Lungs: Clear to auscultation bilaterally.  Heart: Regular rate and rhythm, no murmurs rubs or gallops.  Abdomen: Bowel sounds are normal, nontender, nondistended, no hepatosplenomegaly or masses,  no abdominal bruits or hernia, no rebound or guarding.   Rectal: *** Extremities: No lower extremity edema. No clubbing or deformities.  Neuro: Alert and oriented x 4 , grossly normal neurologically.  Skin: Warm and dry, no rash or jaundice.   Psych: Alert and cooperative, normal mood and affect.  Labs   *** Imaging Studies   No results found.  Assessment       PLAN   ***   Leanna Battles. Melvyn Neth, MHS, PA-C [facility]

## 2022-01-22 ENCOUNTER — Encounter: Payer: Self-pay | Admitting: Gastroenterology

## 2022-01-22 ENCOUNTER — Ambulatory Visit (INDEPENDENT_AMBULATORY_CARE_PROVIDER_SITE_OTHER): Payer: Medicare HMO | Admitting: Gastroenterology

## 2022-01-22 VITALS — BP 156/89 | HR 60 | Temp 98.0°F | Ht 66.0 in | Wt 156.4 lb

## 2022-01-22 DIAGNOSIS — R6881 Early satiety: Secondary | ICD-10-CM

## 2022-01-22 DIAGNOSIS — R053 Chronic cough: Secondary | ICD-10-CM | POA: Diagnosis not present

## 2022-01-22 DIAGNOSIS — R161 Splenomegaly, not elsewhere classified: Secondary | ICD-10-CM | POA: Diagnosis not present

## 2022-01-22 DIAGNOSIS — R634 Abnormal weight loss: Secondary | ICD-10-CM | POA: Diagnosis not present

## 2022-01-22 MED ORDER — PANTOPRAZOLE SODIUM 40 MG PO TBEC: 40.0000 mg | DELAYED_RELEASE_TABLET | Freq: Every day | ORAL | 3 refills | Status: DC

## 2022-01-22 NOTE — Patient Instructions (Signed)
Start pantoprazole 40mg  daily, 30 minutes before breakfast. Abdominal ultrasound as scheduled. We will be in touch with results as available.

## 2022-01-24 ENCOUNTER — Ambulatory Visit (HOSPITAL_COMMUNITY)
Admission: RE | Admit: 2022-01-24 | Discharge: 2022-01-24 | Disposition: A | Discharge status: Home / Self Care | Payer: Medicare HMO | Source: Ambulatory Visit | Attending: Gastroenterology | Admitting: Gastroenterology

## 2022-01-24 DIAGNOSIS — R161 Splenomegaly, not elsewhere classified: Secondary | ICD-10-CM | POA: Diagnosis present

## 2022-01-28 ENCOUNTER — Telehealth: Payer: Self-pay

## 2022-01-28 NOTE — Telephone Encounter (Signed)
Pt's wife called regarding the pt's recent US. They are wanting to know the results/recommendations. Informed her that you were out and that it would be addressed tomorrow. Pt's wife verbalized understanding.

## 2022-01-28 NOTE — Telephone Encounter (Signed)
See result note.  

## 2022-02-14 ENCOUNTER — Telehealth: Payer: Self-pay | Admitting: *Deleted

## 2022-02-14 ENCOUNTER — Ambulatory Visit (HOSPITAL_COMMUNITY)
Admission: RE | Admit: 2022-02-14 | Discharge: 2022-02-14 | Disposition: A | Discharge status: Home / Self Care | Payer: Medicare HMO | Source: Ambulatory Visit | Attending: Gastroenterology | Admitting: Gastroenterology

## 2022-02-14 ENCOUNTER — Other Ambulatory Visit: Payer: Self-pay | Admitting: *Deleted

## 2022-02-14 DIAGNOSIS — R053 Chronic cough: Secondary | ICD-10-CM

## 2022-02-14 NOTE — Telephone Encounter (Signed)
Please arrange for two view chest xray for chronic cough.  Once that comes back can we try to get the CT chest approved and do all the CTs (chest/abd/pelvis) at the same time?

## 2022-02-14 NOTE — Telephone Encounter (Signed)
Pt informed of providers message and recommendations, verbalized understanding. Chest xray ordered in epic.

## 2022-02-14 NOTE — Telephone Encounter (Signed)
[employer]s faxed for PA CT chest abd was denied due to the following : "After review of Medicare Local Coverage Determinations (LCD): [id number] Computerized Axial  Tomography (CT): Thorax and eviCore Chest Imaging Guidelines Section(s): Cough (CH 3.1),  we cannot approve this request. Your healthcare provider told us that you have a chronic (long  term) cough. The request cannot be approved because: Your records do not show normal results of a chest x-ray taken after your symptoms started or  changed. You must have results of a chest x ray done after your symptoms started or worsened that show a  need for further imaging."

## 2022-02-19 ENCOUNTER — Telehealth: Payer: Self-pay

## 2022-02-19 NOTE — Telephone Encounter (Signed)
Chest xray results back, see result note. Let's try to get the Chest CT approved now so we can do chest/abd/pelvis at same time.  Add Dx: chronic bronchitic changes on chest xray to list of diagnoses already provided.

## 2022-02-19 NOTE — Telephone Encounter (Signed)
See result note for instructions.

## 2022-02-19 NOTE — Telephone Encounter (Signed)
Pt's wife called wanting to know the results to his recent xray. Informed pt's wife that it was resulted yesterday and that the provider has 5 business days to look over it and get the results back to the pt.

## 2022-02-19 NOTE — Telephone Encounter (Signed)
Faxed appeal to [employer]

## 2022-02-20 ENCOUNTER — Telehealth: Payer: Self-pay | Admitting: *Deleted

## 2022-02-20 NOTE — Telephone Encounter (Signed)
Called [employer] provider line and spoke with representative to do an appeal for the denied chest CT w/contrast. Gave more info on pt and was told it would be sent to the appeals team and has a turn around time of 72 hours. They will be in contact if needs any type of documentation on pt.       

## 2022-02-20 NOTE — Telephone Encounter (Signed)
[employer] provider line and spoke with representative to do an appeal for the denied chest CT w/contrast. Gave more info on pt and was told it would be sent to the appeals team and has a turn around time of 72 hours. They will be in contact if needs any type of documentation on pt.

## 2022-02-25 ENCOUNTER — Other Ambulatory Visit: Payer: Self-pay | Admitting: *Deleted

## 2022-02-25 ENCOUNTER — Telehealth: Payer: Self-pay

## 2022-02-25 DIAGNOSIS — R6881 Early satiety: Secondary | ICD-10-CM

## 2022-02-25 DIAGNOSIS — R053 Chronic cough: Secondary | ICD-10-CM

## 2022-02-25 DIAGNOSIS — R634 Abnormal weight loss: Secondary | ICD-10-CM

## 2022-02-25 NOTE — Telephone Encounter (Signed)
Pt is requesting a copy of the chest xray to be sent to his pcp.

## 2022-02-26 NOTE — Telephone Encounter (Signed)
CT chest has been approved. Pt has been scheduled for CT Abd/pelvis/chest on11/20/23 at 4:30, arrive at 2:30 pm to drink contrast. Pt informed and verbalized understanding.

## 2022-03-10 ENCOUNTER — Ambulatory Visit (HOSPITAL_COMMUNITY)
Admission: RE | Admit: 2022-03-10 | Discharge: 2022-03-10 | Disposition: A | Discharge status: Home / Self Care | Payer: Medicare HMO | Source: Ambulatory Visit | Attending: Gastroenterology | Admitting: Gastroenterology

## 2022-03-10 DIAGNOSIS — R634 Abnormal weight loss: Secondary | ICD-10-CM | POA: Diagnosis present

## 2022-03-10 DIAGNOSIS — R6881 Early satiety: Secondary | ICD-10-CM | POA: Diagnosis present

## 2022-03-10 DIAGNOSIS — R053 Chronic cough: Secondary | ICD-10-CM | POA: Insufficient documentation

## 2022-03-10 MED ORDER — IOHEXOL 9 MG/ML PO SOLN
ORAL | Status: AC
  Filled 2022-03-10: qty 1000

## 2022-03-10 MED ORDER — IOHEXOL 300 MG/ML  SOLN
100.0000 mL | Freq: Once | INTRAMUSCULAR | Status: AC | PRN
  Administered 2022-03-10: 100 mL via INTRAVENOUS

## 2022-03-20 ENCOUNTER — Telehealth: Payer: Self-pay

## 2022-03-20 NOTE — Telephone Encounter (Signed)
Discussed findings with patient. See result note.   Darl Pikes, can we please make sure PCP gets a copy of his CT chest and CT A/P?

## 2022-03-20 NOTE — Telephone Encounter (Signed)
Pt's wife Scott Reeves called stating that the patient would like to know his results to recent imaging that was done on 03/10/22. Informed wife that we have 10 business days to get results back to patient. There is no DPR on file giving [place] permission to give any information to wife (FYI). Informed wife that I would send a message requesting results and that we could contact him once results are read.

## 2022-03-20 NOTE — Telephone Encounter (Signed)
Communication noted.  

## 2022-04-01 ENCOUNTER — Encounter: Payer: Self-pay | Admitting: Orthopedic Surgery

## 2022-04-01 ENCOUNTER — Ambulatory Visit (INDEPENDENT_AMBULATORY_CARE_PROVIDER_SITE_OTHER): Payer: Medicare HMO | Admitting: Orthopedic Surgery

## 2022-04-01 VITALS — BP 133/87 | HR 76 | Ht 66.0 in | Wt 168.0 lb

## 2022-04-01 DIAGNOSIS — M25512 Pain in left shoulder: Secondary | ICD-10-CM | POA: Diagnosis not present

## 2022-04-01 NOTE — Progress Notes (Signed)
New Patient Visit  Assessment: Scott Reeves is a 66 y.o. male with the following: 1. Acute pain of left shoulder  Plan: CHINO SARDO has pain in his left shoulder.  No specific injury.  It has improved some with prednisone.  Reviewed radiographs in clinic today, which do not demonstrate an acute injury.  I have offered him a steroid injection, and he elected to proceed.  He prefers to try some home exercises, which were also provided for him today.  I will see him back in about 2 months for reassessment.  If he has issues before then, he should contact the clinic.  Follow-up in 2 months.   Procedure note injection Left shoulder    Verbal consent was obtained to inject the left shoulder, subacromial space Timeout was completed to confirm the site of injection.  The skin was prepped with alcohol and ethyl chloride was sprayed at the injection site.  A 21-gauge needle was used to inject 40 mg of Depo-Medrol and 1% lidocaine (3 cc) into the subacromial space of the left shoulder using a posterolateral approach.  There were no complications. A sterile bandage was applied.   Follow-up: Return in about 2 months (around 06/02/2022).  Subjective:  Chief Complaint  Patient presents with   Shoulder Pain    Lt shoulder pain for 1 mo. Pt states it's a little better after taking     History of Present Illness: Scott Reeves is a 66 y.o. male who has been referred by Coral Ceo, FNP for evaluation of left shoulder pain.  He has had pain in his left shoulder for the past month.  No specific injury.  No history of injury to his left shoulder.  He has diffuse pain in the left shoulder.  He has limited motion.  He is unable to get his arm above the level of the shoulder.  Pain gets worse at night.  The further he gets from his body, the more pain he has.  He was given some prednisone by his family doctor, and this has improved his symptoms.  He has not had an injection.  No physical  therapy.   Review of Systems: No fevers or chills No numbness or tingling No chest pain No shortness of breath No bowel or bladder dysfunction No GI distress No headaches   Medical History:  Past Medical History:  Diagnosis Date   Anxiety    Coronary atherosclerosis of native coronary artery    Multivessel s/p CABG 1999   Depression    Sleep apnea     Past Surgical History:  Procedure Laterality Date   BACK SURGERY  1999   CORONARY ARTERY BYPASS GRAFT  1999   Dr. [name]: LIMA to LAD, RIMA to RCA, SVG to ramus, SVG to circumflex    Family History  Problem Relation Age of Onset   Heart attack Father        Deceased   Stroke Brother    CAD Brother    CAD Brother    Lung cancer Paternal Aunt    Cancer - Colon Neg Hx    Social History   Tobacco Use   Smoking status: Former    Types: Cigarettes    Quit date: 04/21/1997    Years since quitting: 24.9   Smokeless tobacco: Current    Types: Chew  Substance Use Topics   Alcohol use: No   Drug use: Yes    Types: Marijuana    Allergies  Allergen  Reactions   Lopid [Gemfibrozil]     Current Meds  Medication Sig   aspirin EC 81 MG tablet Take 1 tablet (81 mg total) by mouth daily.   atorvastatin (LIPITOR) 20 MG tablet Take 20 mg by mouth daily.   CLARITIN 10 MG tablet 1 tablet Orally Once a day for 30 days   magnesium oxide (MAG-OX) 400 (240 Mg) MG tablet Take 400 mg by mouth daily.   naproxen (NAPROSYN) 500 MG tablet 1 tablet with food or milk as needed Orally every 12 hrs for 30 days   pantoprazole (PROTONIX) 40 MG tablet Take 1 tablet (40 mg total) by mouth daily.   Sertraline HCl 150 MG CAPS Take 150 mg by mouth at bedtime.    Objective: BP 133/87   Pulse 76   Ht 5\' 6"  (1.676 m)   Wt 168 lb (76.2 kg)   BMI 27.12 kg/m   Physical Exam:  General: Alert and oriented. and No acute distress. Gait: Normal gait.  Evaluation of left shoulder demonstrates no deformity.  No atrophy is appreciated.  No  redness.  Forward flexion limited to 90 degrees.  He tolerates passive flexion to approximate 120 degrees.  Abduction at his side to 90 degrees.  He has obvious pain with strength testing.  Fingers are warm and well-perfused.   IMAGING: I personally reviewed images previously obtained in clinic  X-rays from his prior clinic visit were reviewed in clinic today.  No acute injuries.  No bony lesions.  No evidence of proximal humeral migration.   New Medications:  No orders of the defined types were placed in this encounter.     , MD  04/01/2022 6:21 PM

## 2022-04-01 NOTE — Patient Instructions (Addendum)

## 2022-04-21 ENCOUNTER — Other Ambulatory Visit: Payer: Self-pay | Admitting: Gastroenterology

## 2022-05-07 ENCOUNTER — Encounter: Payer: Self-pay | Admitting: Internal Medicine

## 2022-05-07 ENCOUNTER — Ambulatory Visit (INDEPENDENT_AMBULATORY_CARE_PROVIDER_SITE_OTHER): Payer: Medicare HMO | Admitting: Internal Medicine

## 2022-05-07 VITALS — BP 132/84 | HR 67 | Temp 97.7°F | Ht 66.0 in | Wt 164.3 lb

## 2022-05-07 DIAGNOSIS — K219 Gastro-esophageal reflux disease without esophagitis: Secondary | ICD-10-CM | POA: Diagnosis not present

## 2022-05-07 DIAGNOSIS — R6881 Early satiety: Secondary | ICD-10-CM

## 2022-05-07 DIAGNOSIS — R634 Abnormal weight loss: Secondary | ICD-10-CM | POA: Diagnosis not present

## 2022-05-07 DIAGNOSIS — R195 Other fecal abnormalities: Secondary | ICD-10-CM

## 2022-05-07 NOTE — Progress Notes (Signed)
Referring Provider: Coolidge Breeze, FNP Primary Care Physician:  Coolidge Breeze, FNP Primary GI:  Dr. Abbey Chatters  Chief Complaint  Patient presents with   Weight Loss    Follow up on weight loss. Last weight in chart was 168 lbs on 04/01/22 and today 164.3 lbs. Reports appetite comes and goes. Eats about 2 small meals a day. Gets full quick.      HPI:   Scott Reeves is a 67 y.o. male who presents to the clinic today for follow-up visit.  Has been seen for weight loss, early satiety, chronic GERD.  CT chest abdomen pelvis 03/10/2022 without findings to explain his symptoms.  Mild bladder wall thickening.  Patient started on pantoprazole 40 mg daily for chronic reflux which she states is well-controlled.  Weight is relatively stable.  States it fluctuates from 1 60-1 70.  Continues to complain of early satiety.  States he will sit down for a meal and become full very quickly.  No previous upper endoscopy or colonoscopy.  Does note recently having Cologuard testing in December 2023 which was positive.  No family history of colorectal malignancy.  No abdominal pain.  No melena hematochezia.  Past Medical History:  Diagnosis Date   Anxiety    Coronary atherosclerosis of native coronary artery    Multivessel s/p CABG 1999   Depression    Sleep apnea     Past Surgical History:  Procedure Laterality Date   BACK SURGERY  1999   CORONARY ARTERY BYPASS GRAFT  1999   Dr. Lucianne Lei Trigt: LIMA to LAD, RIMA to RCA, SVG to ramus, SVG to circumflex    Current Outpatient Medications  Medication Sig Dispense Refill   aspirin EC 81 MG tablet Take 1 tablet (81 mg total) by mouth daily. 30 tablet 1   atorvastatin (LIPITOR) 20 MG tablet Take 20 mg by mouth daily.     finasteride (PROSCAR) 5 MG tablet TAKE 1 TABLET BY MOUTH ONCE DAILY. for 90 days     magnesium oxide (MAG-OX) 400 (240 Mg) MG tablet Take 400 mg by mouth daily.     nitroGLYCERIN (NITROSTAT) 0.4 MG SL tablet Place 1 tablet (0.4  mg total) under the tongue every 5 (five) minutes as needed for chest pain. 30 tablet 0   pantoprazole (PROTONIX) 40 MG tablet TAKE 1 TABLET BY MOUTH EVERY DAY 90 tablet 1   Sertraline HCl 150 MG CAPS Take 150 mg by mouth at bedtime.     tamsulosin (FLOMAX) 0.4 MG CAPS capsule 1 capsule Orally Once a day for 30 days     CLARITIN 10 MG tablet 1 tablet Orally Once a day for 30 days (Patient not taking: Reported on 05/07/2022)     No current facility-administered medications for this visit.    Allergies as of 05/07/2022 - Review Complete 05/07/2022  Allergen Reaction Noted   Lopid [gemfibrozil]  06/26/2010    Family History  Problem Relation Age of Onset   Heart attack Father        Deceased   Stroke Brother    CAD Brother    CAD Brother    Lung cancer Paternal Aunt    Cancer - Colon Neg Hx     Social History   Socioeconomic History   Marital status: Married    Spouse name: Not on file   Number of children: Not on file   Years of education: Not on file   Highest education level: Not on file  Occupational History   Not on file  Tobacco Use   Smoking status: Former    Types: Cigarettes    Quit date: 04/21/1997    Years since quitting: 25.0    Passive exposure: Past   Smokeless tobacco: Current    Types: Chew  Substance and Sexual Activity   Alcohol use: No   Drug use: Yes    Types: Marijuana   Sexual activity: Yes  Other Topics Concern   Not on file  Social History Narrative   Not on file   Social Determinants of Health   Financial Resource Strain: Not on file  Food Insecurity: Not on file  Transportation Needs: Not on file  Physical Activity: Not on file  Stress: Not on file  Social Connections: Not on file    Subjective: Review of Systems  Constitutional:  Negative for chills and fever.  HENT:  Negative for congestion and hearing loss.   Eyes:  Negative for blurred vision and double vision.  Respiratory:  Negative for cough and shortness of breath.    Cardiovascular:  Negative for chest pain and palpitations.  Gastrointestinal:  Negative for abdominal pain, blood in stool, constipation, diarrhea, heartburn, melena and vomiting.       Early satiety  Genitourinary:  Negative for dysuria and urgency.  Musculoskeletal:  Negative for joint pain and myalgias.  Skin:  Negative for itching and rash.  Neurological:  Negative for dizziness and headaches.  Psychiatric/Behavioral:  Negative for depression. The patient is not nervous/anxious.      Objective: BP 132/84 (BP Location: Left Arm, Patient Position: Sitting, Cuff Size: Large)   Pulse 67   Temp 97.7 F (36.5 C) (Oral)   Ht 5\' 6"  (1.676 m)   Wt 164 lb 4.8 oz (74.5 kg)   BMI 26.52 kg/m  Physical Exam Constitutional:      Appearance: Normal appearance.  HENT:     Head: Normocephalic and atraumatic.  Eyes:     Extraocular Movements: Extraocular movements intact.     Conjunctiva/sclera: Conjunctivae normal.  Cardiovascular:     Rate and Rhythm: Normal rate and regular rhythm.  Pulmonary:     Effort: Pulmonary effort is normal.     Breath sounds: Normal breath sounds.  Abdominal:     General: Bowel sounds are normal.     Palpations: Abdomen is soft.  Musculoskeletal:        General: Normal range of motion.     Cervical back: Normal range of motion and neck supple.  Skin:    General: Skin is warm.  Neurological:     General: No focal deficit present.     Mental Status: He is alert and oriented to person, place, and time.  Psychiatric:        Mood and Affect: Mood normal.        Behavior: Behavior normal.      Assessment: *Chronic GERD-well-controlled on pantoprazole daily *Weight loss *Early satiety *Positive Cologuard  Plan: Patient's symptoms have been worked up extensively with CT imaging, see HPI.  Given recent positive Cologuard, I highly recommended a colonoscopy to further evaluate his symptoms.  We could perform upper endoscopy at the same time to further  evaluate his chronic GERD, weight loss, early satiety.  Patient would like to think about this today.  I explained to him that if he decides to pursue all he needs to do is call the office and we will schedule.  ASA 3.  I did explain to patient and  his wife that by not pursuing colonoscopy we may be missing polyps and/or even colon cancer.  Patient is concerned about bowel prep itself.  We would try to use a low volume prep that he could tolerate.  Continue pantoprazole daily for chronic GERD which is well-controlled.  Follow-up in 6 months.  05/07/2022 3:45 PM   Disclaimer: This note was dictated with voice recognition software. Similar sounding words can inadvertently be transcribed and may not be corrected upon review.

## 2022-05-07 NOTE — Patient Instructions (Addendum)
Given your recent positive Cologuard testing, ongoing issues with early satiety and weight instability, I would recommend we pursue further evaluation with upper endoscopy and colonoscopy.  Positive Cologuard testing could be indicative of precancerous polyps in your colon and/or even potentially colon cancer.  If you decide you would like to have these procedures done, then please call our office and we will get them scheduled.  Continue on pantoprazole daily for your chronic reflux.  Otherwise follow-up with GI in 6 months.  It was very nice meeting both you today.  Dr. Abbey Chatters

## 2022-05-07 NOTE — Progress Notes (Signed)
Referring Provider: Coolidge Breeze, FNP Primary Care Physician:  Coolidge Breeze, FNP Primary GI:  Dr. Abbey Chatters  Chief Complaint  Patient presents with   Weight Loss    Follow up on weight loss. Last weight in chart was 168 lbs on 04/01/22 and today 164.3 lbs. Reports appetite comes and goes. Eats about 2 small meals a day. Gets full quick.      HPI:   Scott Reeves is a 67 y.o. male who presents   Past Medical History:  Diagnosis Date   Anxiety    Coronary atherosclerosis of native coronary artery    Multivessel s/p CABG 1999   Depression    Sleep apnea     Past Surgical History:  Procedure Laterality Date   BACK SURGERY  1999   CORONARY ARTERY BYPASS GRAFT  1999   Dr. Lucianne Lei Trigt: LIMA to LAD, RIMA to RCA, SVG to ramus, SVG to circumflex    Current Outpatient Medications  Medication Sig Dispense Refill   aspirin EC 81 MG tablet Take 1 tablet (81 mg total) by mouth daily. 30 tablet 1   atorvastatin (LIPITOR) 20 MG tablet Take 20 mg by mouth daily.     finasteride (PROSCAR) 5 MG tablet TAKE 1 TABLET BY MOUTH ONCE DAILY. for 90 days     magnesium oxide (MAG-OX) 400 (240 Mg) MG tablet Take 400 mg by mouth daily.     nitroGLYCERIN (NITROSTAT) 0.4 MG SL tablet Place 1 tablet (0.4 mg total) under the tongue every 5 (five) minutes as needed for chest pain. 30 tablet 0   pantoprazole (PROTONIX) 40 MG tablet TAKE 1 TABLET BY MOUTH EVERY DAY 90 tablet 1   Sertraline HCl 150 MG CAPS Take 150 mg by mouth at bedtime.     tamsulosin (FLOMAX) 0.4 MG CAPS capsule 1 capsule Orally Once a day for 30 days     CLARITIN 10 MG tablet 1 tablet Orally Once a day for 30 days (Patient not taking: Reported on 05/07/2022)     No current facility-administered medications for this visit.    Allergies as of 05/07/2022 - Review Complete 05/07/2022  Allergen Reaction Noted   Lopid [gemfibrozil]  06/26/2010    Family History  Problem Relation Age of Onset   Heart attack Father        Deceased    Stroke Brother    CAD Brother    CAD Brother    Lung cancer Paternal Aunt    Cancer - Colon Neg Hx     Social History   Socioeconomic History   Marital status: Married    Spouse name: Not on file   Number of children: Not on file   Years of education: Not on file   Highest education level: Not on file  Occupational History   Not on file  Tobacco Use   Smoking status: Former    Types: Cigarettes    Quit date: 04/21/1997    Years since quitting: 25.0    Passive exposure: Past   Smokeless tobacco: Current    Types: Chew  Substance and Sexual Activity   Alcohol use: No   Drug use: Yes    Types: Marijuana   Sexual activity: Yes  Other Topics Concern   Not on file  Social History Narrative   Not on file   Social Determinants of Health   Financial Resource Strain: Not on file  Food Insecurity: Not on file  Transportation Needs: Not on file  Physical Activity: Not on file  Stress: Not on file  Social Connections: Not on file    Subjective: Review of Systems  Constitutional:  Negative for chills and fever.  HENT:  Negative for congestion and hearing loss.   Eyes:  Negative for blurred vision and double vision.  Respiratory:  Negative for cough and shortness of breath.   Cardiovascular:  Negative for chest pain and palpitations.  Gastrointestinal:  Negative for abdominal pain, blood in stool, constipation, diarrhea, heartburn, melena and vomiting.  Genitourinary:  Negative for dysuria and urgency.  Musculoskeletal:  Negative for joint pain and myalgias.  Skin:  Negative for itching and rash.  Neurological:  Negative for dizziness and headaches.  Psychiatric/Behavioral:  Negative for depression. The patient is not nervous/anxious.   All other systems reviewed and are negative.    Objective: BP 132/84 (BP Location: Left Arm, Patient Position: Sitting, Cuff Size: Large)   Pulse 67   Temp 97.7 F (36.5 C) (Oral)   Ht 5\' 6"  (1.676 m)   Wt 164 lb 4.8 oz (74.5 kg)    BMI 26.52 kg/m  Physical Exam   Assessment: *  Plan:   05/07/2022 3:33 PM   Disclaimer: This note was dictated with voice recognition software. Similar sounding words can inadvertently be transcribed and may not be corrected upon review.

## 2022-06-10 ENCOUNTER — Ambulatory Visit (INDEPENDENT_AMBULATORY_CARE_PROVIDER_SITE_OTHER): Payer: Medicare HMO | Admitting: Orthopedic Surgery

## 2022-06-10 ENCOUNTER — Ambulatory Visit: Payer: Medicare HMO | Admitting: Orthopedic Surgery

## 2022-06-10 ENCOUNTER — Encounter: Payer: Self-pay | Admitting: Orthopedic Surgery

## 2022-06-10 VITALS — Ht 66.0 in | Wt 164.0 lb

## 2022-06-10 DIAGNOSIS — M25512 Pain in left shoulder: Secondary | ICD-10-CM | POA: Diagnosis not present

## 2022-06-10 NOTE — Patient Instructions (Signed)

## 2022-06-11 NOTE — Progress Notes (Signed)
New Patient Visit  Assessment: Scott Reeves is a 67 y.o. male with the following: 1. Acute pain of left shoulder  Plan: JILES JACOX has had some improvement in his left shoulder pain.  He has been doing exercises on his own.  He notes improved motion, but is still struggling, especially with overhead motion.  We discussed multiple treatment options, and he is not interested in formal physical therapy.  He is amenable to a glenohumeral joint injection.  This was completed in clinic under ultrasound guidance.  Based on his current presentation, he may have an injury in the shoulder, which may require further workup.  We should consider physical therapy and an MRI in the future.  Procedure note injection - Left shoulder, ultrasound guidance   Verbal consent was obtained to inject the Left shoulder, glenohumeral joint  Timeout was completed to confirm the site of injection.   Using the ultrasound, the rotator cuff tendons were identified.  The joint space was also identified. The skin was prepped with alcohol and ethyl chloride was sprayed at the injection site.  A 21-gauge needle was used to inject 40 mg of Depo-Medrol and 1% lidocaine (4 cc) into the glenohumeral joint space of the Left shoulder using a posterolateral approach.  The needle was visualized entering the glenohumeral joint, and the medication was also visualized. There were no complications.  A sterile bandage was applied.   Note: In order to accurately identify the placement of the needle, ultrasound was required, to increase the accuracy, and specificity of the injection.    Follow-up: Return if symptoms worsen or fail to improve.  Subjective:  Chief Complaint  Patient presents with   Shoulder Pain    L shoulder feeling better after injection but still having painful areas when doing certain movements.  [place]: [id number] Lot: [id number] Exp:09/19/23    History of Present Illness: Scott Reeves is a 67 y.o. male  who returns to clinic for evaluation of left shoulder pain.  I saw him in clinic approximately 2 months ago.  At that time, he had a subacromial steroid injection.  He notes some improvement in his symptoms.  He still has difficulty with overhead motion.  He has been doing exercises on his own.  His wife is with him in clinic today, and she states that he is still having a lot of discomfort.  He has not worked with physical therapy.   Review of Systems: No fevers or chills No numbness or tingling No chest pain No shortness of breath No bowel or bladder dysfunction No GI distress No headaches    Objective: Ht 5' 6"$  (1.676 m)   Wt 164 lb (74.4 kg)   BMI 26.47 kg/m   Physical Exam:  General: Alert and oriented. and No acute distress. Gait: Normal gait.  Evaluation of left shoulder demonstrates no deformity.  No atrophy is appreciated.  No redness.  Forward flexion limited to 100 degrees.  He tolerates passive flexion to approximate 120 degrees.  Abduction at his side to 100 degrees.  Positive Jobe's.  Slightly restricted external rotation.  Fingers are warm and well-perfused.   IMAGING: I personally reviewed images previously obtained in clinic  X-rays from his prior clinic visit were reviewed in clinic today.  No acute injuries.  No bony lesions.  No evidence of proximal humeral migration.   New Medications:  No orders of the defined types were placed in this encounter.     Mordecai Rasmussen, MD  06/11/2022 8:11 AM

## 2022-06-19 ENCOUNTER — Encounter: Payer: Self-pay | Admitting: Radiology

## 2022-07-08 ENCOUNTER — Ambulatory Visit: Payer: Medicare HMO | Admitting: Orthopedic Surgery

## 2022-11-05 ENCOUNTER — Ambulatory Visit: Payer: Medicare HMO | Admitting: Internal Medicine
